# Patient Record
Sex: Female | Born: 1965 | Race: White | Hispanic: No | Smoking: Current every day smoker
Health system: Southern US, Community
[De-identification: ages and names within clinical notes are randomized; demographics above are authoritative.]

## PROBLEM LIST (undated history)

## (undated) DIAGNOSIS — L80 Vitiligo: Secondary | ICD-10-CM

## (undated) DIAGNOSIS — K219 Gastro-esophageal reflux disease without esophagitis: Secondary | ICD-10-CM

## (undated) DIAGNOSIS — E785 Hyperlipidemia, unspecified: Secondary | ICD-10-CM

## (undated) DIAGNOSIS — B009 Herpesviral infection, unspecified: Secondary | ICD-10-CM

## (undated) DIAGNOSIS — E039 Hypothyroidism, unspecified: Secondary | ICD-10-CM

## (undated) DIAGNOSIS — Z79890 Hormone replacement therapy (postmenopausal): Secondary | ICD-10-CM

## (undated) DIAGNOSIS — F419 Anxiety disorder, unspecified: Secondary | ICD-10-CM

## (undated) DIAGNOSIS — L900 Lichen sclerosus et atrophicus: Principal | ICD-10-CM

## (undated) DIAGNOSIS — Z780 Asymptomatic menopausal state: Secondary | ICD-10-CM

## (undated) DIAGNOSIS — R011 Cardiac murmur, unspecified: Secondary | ICD-10-CM

## (undated) DIAGNOSIS — R232 Flushing: Secondary | ICD-10-CM

## (undated) DIAGNOSIS — R4589 Other symptoms and signs involving emotional state: Secondary | ICD-10-CM

## (undated) HISTORY — DX: Lichen sclerosus et atrophicus: L90.0

## (undated) HISTORY — DX: Vitiligo: L80

## (undated) HISTORY — DX: Flushing: R23.2

## (undated) HISTORY — PX: TONSILLECTOMY: SUR1361

## (undated) HISTORY — DX: Hyperlipidemia, unspecified: E78.5

## (undated) HISTORY — DX: Herpesviral infection, unspecified: B00.9

## (undated) HISTORY — PX: FINGER SURGERY: SHX640

## (undated) HISTORY — PX: TOE SURGERY: SHX1073

## (undated) HISTORY — DX: Anxiety disorder, unspecified: F41.9

## (undated) HISTORY — DX: Hormone replacement therapy: Z79.890

## (undated) HISTORY — PX: TUBAL LIGATION: SHX77

## (undated) HISTORY — DX: Other symptoms and signs involving emotional state: R45.89

## (undated) HISTORY — PX: ECTOPIC PREGNANCY SURGERY: SHX613

## (undated) HISTORY — PX: ESOPHAGOGASTRODUODENOSCOPY: SHX1529

## (undated) HISTORY — DX: Asymptomatic menopausal state: Z78.0

---

## 2001-07-09 ENCOUNTER — Other Ambulatory Visit: Admission: RE | Admit: 2001-07-09 | Discharge: 2001-07-09 | Payer: Self-pay | Admitting: Obstetrics and Gynecology

## 2001-07-11 ENCOUNTER — Ambulatory Visit (HOSPITAL_COMMUNITY): Admission: RE | Admit: 2001-07-11 | Discharge: 2001-07-11 | Payer: Self-pay | Admitting: Obstetrics and Gynecology

## 2001-07-11 ENCOUNTER — Encounter: Payer: Self-pay | Admitting: Obstetrics and Gynecology

## 2003-04-20 ENCOUNTER — Encounter (HOSPITAL_COMMUNITY): Admission: RE | Admit: 2003-04-20 | Discharge: 2003-05-20 | Payer: Self-pay | Admitting: Family Medicine

## 2006-03-12 ENCOUNTER — Ambulatory Visit (HOSPITAL_COMMUNITY): Admission: RE | Admit: 2006-03-12 | Discharge: 2006-03-12 | Payer: Self-pay | Admitting: Family Medicine

## 2007-08-06 ENCOUNTER — Ambulatory Visit (HOSPITAL_COMMUNITY): Admission: RE | Admit: 2007-08-06 | Discharge: 2007-08-06 | Payer: Self-pay | Admitting: Family Medicine

## 2008-10-02 ENCOUNTER — Emergency Department (HOSPITAL_COMMUNITY): Admission: EM | Admit: 2008-10-02 | Discharge: 2008-10-02 | Payer: Self-pay | Admitting: Emergency Medicine

## 2008-10-19 ENCOUNTER — Emergency Department (HOSPITAL_COMMUNITY): Admission: EM | Admit: 2008-10-19 | Discharge: 2008-10-19 | Payer: Self-pay | Admitting: Emergency Medicine

## 2009-02-09 ENCOUNTER — Ambulatory Visit (HOSPITAL_COMMUNITY): Admission: RE | Admit: 2009-02-09 | Discharge: 2009-02-09 | Payer: Self-pay | Admitting: Family Medicine

## 2010-08-21 LAB — URINALYSIS, ROUTINE W REFLEX MICROSCOPIC
Bilirubin Urine: NEGATIVE
Glucose, UA: NEGATIVE mg/dL
Hgb urine dipstick: NEGATIVE
Ketones, ur: NEGATIVE mg/dL
Nitrite: NEGATIVE
Protein, ur: NEGATIVE mg/dL
Specific Gravity, Urine: <1.005 — ABNORMAL LOW (ref 1.005–1.030)
Urobilinogen, UA: 0.2 mg/dL (ref 0.0–1.0)
pH: 5.5 (ref 5.0–8.0)

## 2010-08-21 LAB — URINE CULTURE
Colony Count: NO GROWTH
Culture: NO GROWTH

## 2010-08-21 LAB — PREGNANCY, URINE: Preg Test, Ur: NEGATIVE

## 2010-08-22 LAB — URINALYSIS, ROUTINE W REFLEX MICROSCOPIC
Hgb urine dipstick: NEGATIVE
Ketones, ur: NEGATIVE mg/dL
Protein, ur: NEGATIVE mg/dL
Urobilinogen, UA: 0.2 mg/dL (ref 0.0–1.0)

## 2010-08-22 LAB — URINE CULTURE: Colony Count: 5000

## 2010-08-22 LAB — URINE MICROSCOPIC-ADD ON

## 2011-03-15 ENCOUNTER — Other Ambulatory Visit (HOSPITAL_COMMUNITY): Payer: Self-pay | Admitting: Family Medicine

## 2011-03-15 DIAGNOSIS — Z139 Encounter for screening, unspecified: Secondary | ICD-10-CM

## 2011-03-20 ENCOUNTER — Ambulatory Visit (HOSPITAL_COMMUNITY): Payer: Self-pay

## 2011-03-29 ENCOUNTER — Ambulatory Visit (HOSPITAL_COMMUNITY)
Admission: RE | Admit: 2011-03-29 | Discharge: 2011-03-29 | Disposition: A | Discharge status: Home / Self Care | Payer: PRIVATE HEALTH INSURANCE | Source: Ambulatory Visit | Attending: Family Medicine | Admitting: Family Medicine

## 2011-03-29 DIAGNOSIS — Z139 Encounter for screening, unspecified: Secondary | ICD-10-CM

## 2011-03-29 DIAGNOSIS — Z1231 Encounter for screening mammogram for malignant neoplasm of breast: Secondary | ICD-10-CM | POA: Insufficient documentation

## 2011-04-03 ENCOUNTER — Other Ambulatory Visit: Payer: Self-pay | Admitting: Family Medicine

## 2011-04-03 DIAGNOSIS — R928 Other abnormal and inconclusive findings on diagnostic imaging of breast: Secondary | ICD-10-CM

## 2011-04-18 ENCOUNTER — Other Ambulatory Visit: Payer: Self-pay | Admitting: Family Medicine

## 2011-04-18 ENCOUNTER — Ambulatory Visit (HOSPITAL_COMMUNITY)
Admission: RE | Admit: 2011-04-18 | Discharge: 2011-04-18 | Disposition: A | Discharge status: Home / Self Care | Payer: PRIVATE HEALTH INSURANCE | Source: Ambulatory Visit | Attending: Family Medicine | Admitting: Family Medicine

## 2011-04-18 DIAGNOSIS — R928 Other abnormal and inconclusive findings on diagnostic imaging of breast: Secondary | ICD-10-CM

## 2011-10-22 ENCOUNTER — Encounter (HOSPITAL_COMMUNITY): Payer: Self-pay | Admitting: *Deleted

## 2011-10-22 ENCOUNTER — Emergency Department (HOSPITAL_COMMUNITY)
Admission: EM | Admit: 2011-10-22 | Discharge: 2011-10-22 | Disposition: A | Discharge status: Home / Self Care | Payer: Self-pay | Attending: Emergency Medicine | Admitting: Emergency Medicine

## 2011-10-22 DIAGNOSIS — F17200 Nicotine dependence, unspecified, uncomplicated: Secondary | ICD-10-CM | POA: Insufficient documentation

## 2011-10-22 DIAGNOSIS — K219 Gastro-esophageal reflux disease without esophagitis: Secondary | ICD-10-CM | POA: Insufficient documentation

## 2011-10-22 DIAGNOSIS — B354 Tinea corporis: Secondary | ICD-10-CM | POA: Insufficient documentation

## 2011-10-22 DIAGNOSIS — Z79899 Other long term (current) drug therapy: Secondary | ICD-10-CM | POA: Insufficient documentation

## 2011-10-22 HISTORY — DX: Gastro-esophageal reflux disease without esophagitis: K21.9

## 2011-10-22 MED ORDER — DOXYCYCLINE HYCLATE 100 MG PO CAPS: 100.0000 mg | ORAL_CAPSULE | Freq: Two times a day (BID) | ORAL | Status: AC

## 2011-10-22 MED ORDER — CLOTRIMAZOLE-BETAMETHASONE 1-0.05 % EX CREA: TOPICAL_CREAM | CUTANEOUS | Status: AC

## 2011-10-22 NOTE — ED Notes (Signed)
Pt c/o rash to the back of her left leg with itching x 3 months. Seen by PCP and given cream but it did not work.

## 2011-10-22 NOTE — ED Notes (Signed)
Pt has approximately 3 x 3 inch round area of raised red rash that has been there for 6 months per patient. Pt c/o itching. Alert and oriented x 3. Skin warm and dry. Color pink. No acute distress.

## 2011-10-22 NOTE — Discharge Instructions (Signed)
Ringworm, Body [Tinea Corporis]  Ringworm is a fungal infection of the skin and hair. Another name for this problem is Tinea Corporis. It has nothing to do with worms. A fungus is an organism that lives on dead cells (the outer layer of skin). It can involve the entire body. It can spread from infected pets. Tinea corporis can be a problem in wrestlers who may get the infection form other players/opponents, equipment and mats.  DIAGNOSIS   A skin scraping can be obtained from the affected area and by looking for fungus under the microscope. This is called a KOH examination.   HOME CARE INSTRUCTIONS    Ringworm may be treated with a topical antifungal cream, ointment, or oral medications.   If you are using a cream or ointment, wash infected skin. Dry it completely before application.   Scrub the skin with a buff puff or abrasive sponge using a shampoo with ketoconazole to remove dead skin and help treat the ringworm.   Have your pet treated by your veterinarian if it has the same infection.  SEEK MEDICAL CARE IF:    Your ringworm patch (fungus) continues to spread after 7 days of treatment.   Your rash is not gone in 4 weeks. Fungal infections are slow to respond to treatment. Some redness (erythema) may remain for several weeks after the fungus is gone.   The area becomes red, warm, tender, and swollen beyond the patch. This may be a secondary bacterial (germ) infection.   You have a fever.  Document Released: 04/27/2000 Document Revised: 04/19/2011 Document Reviewed: 10/08/2008  [employer] Patient Information 2012 [employer].

## 2011-10-22 NOTE — ED Provider Notes (Signed)
History     CSN: [id number]  Arrival date & time 10/22/11  0845   First MD Initiated Contact with Patient 10/22/11 [phone]      Chief Complaint  Patient presents with  . Rash    (Consider location/radiation/quality/duration/timing/severity/associated sxs/prior treatment) Patient is a 46 y.o. female presenting with rash. The history is provided by the patient.  Rash  This is a chronic problem. The current episode started more than 1 week ago. The problem has not changed since onset.The problem is associated with an unknown factor. There has been no fever. The rash is present on the left upper leg. The patient is experiencing no pain. Associated symptoms include itching. Associated symptoms comments: Burning sensation. Treatments tried: nizoral cream. The treatment provided no relief.    Past Medical History  Diagnosis Date  . GERD (gastroesophageal reflux disease)     Past Surgical History  Procedure Date  . Ectopic pregnancy surgery     History reviewed. No pertinent family history.  History  Substance Use Topics  . Smoking status: Current Everyday Smoker -- 1.0 packs/day    Types: Cigarettes  . Smokeless tobacco: Not on file  . Alcohol Use: Yes    OB History    Grav Para Term Preterm Abortions TAB SAB Ect Mult Living                  Review of Systems  Constitutional: Negative for fever and chills.  Musculoskeletal: Negative for joint swelling and arthralgias.  Skin: Positive for itching and rash. Negative for color change and wound.  Neurological: Negative for dizziness and headaches.  All other systems reviewed and are negative.    Allergies  Review of patient's allergies indicates no known allergies.  Home Medications   Current Outpatient Rx  Name Route Sig Dispense Refill  . ALPRAZOLAM 1 MG PO TABS Oral Take 1 mg by mouth at bedtime as needed. For sleep    . VITAMIN C PO Oral Take 1 tablet by mouth daily.    Marland Kitchen CALCIUM CARBONATE-VITAMIN D 500-200  MG-UNIT PO TABS Oral Take 1 tablet by mouth daily.    Marland Kitchen CITALOPRAM HYDROBROMIDE 40 MG PO TABS Oral Take 40 mg by mouth daily.    Marland Kitchen CLOTRIMAZOLE 1 % EX CREA Topical Apply 1 application topically 2 (two) times daily.    . GUAIFENESIN ER 600 MG PO TB12 Oral Take 600 mg by mouth 2 (two) times daily as needed. For congestion    . IBUPROFEN 200 MG PO TABS Oral Take 200 mg by mouth every 6 (six) hours as needed.    Marland Kitchen KETOCONAZOLE 2 % EX CREA Topical Apply 1 application topically 2 (two) times daily.    Marland Kitchen KETOTIFEN FUMARATE 0.025 % OP SOLN Ophthalmic Apply 1 drop to eye 2 (two) times daily as needed. For allergies    . FISH OIL 1000 MG PO CAPS Oral Take 1 capsule by mouth daily.    . DAYQUIL PO Oral Take 2 capsules by mouth daily as needed. For cold symptoms    . COUGH DROPS MT Mouth/Throat Use as directed 1 tablet in the mouth or throat as needed. For cough    . CLOTRIMAZOLE-BETAMETHASONE 1-0.05 % EX CREA  Apply to affected area 2 times daily prn 30 g 0  . DOXYCYCLINE HYCLATE 100 MG PO CAPS Oral Take 1 capsule (100 mg total) by mouth 2 (two) times daily. For 14 days 28 capsule 0    BP 135/47  Pulse 84  Temp(Src) 98.4 F (36.9 C) (Oral)  Resp 16  Ht 5' 6.75" (1.695 m)  Wt 160 lb (72.576 kg)  BMI 25.25 kg/m2  SpO2 96%  Physical Exam  Constitutional: She is oriented to person, place, and time. She appears well-developed and well-nourished. No distress.  HENT:  Head: Normocephalic and atraumatic.  Musculoskeletal: She exhibits no edema and no tenderness.       Left upper leg: She exhibits no tenderness, no bony tenderness, no swelling, no edema, no deformity and no laceration.       Legs: Neurological: She is alert and oriented to person, place, and time. She exhibits normal muscle tone. Coordination normal.  Skin: Skin is warm and dry. Rash noted.    ED Course  Procedures (including critical care time)    1. Tinea corporis       MDM   Single  Slight raised lesion to the left  posterior thigh.  Likely fungal.  Pt reports hx of same that improved with anti-fungal and doxy  Pt agrees to return here if the sx worsen.   Patient / Family / Caregiver understand and agree with initial ED impression and plan with expectations set for ED visit. Pt stable in ED with no significant deterioration in condition. Pt feels improved after observation and/or treatment in ED.     Prescribed : Lotrisone cream and doxy   Jaime Dome L. [place], [place] 10/22/11 2146

## 2011-10-26 NOTE — ED Provider Notes (Signed)
Medical screening examination/treatment/procedure(s) were performed by non-physician practitioner and as supervising physician I was immediately available for consultation/collaboration.  Amilia Vandenbrink T Corderro Koloski, MD 10/26/11 1628 

## 2012-04-03 ENCOUNTER — Other Ambulatory Visit (HOSPITAL_COMMUNITY): Payer: Self-pay | Admitting: Nurse Practitioner

## 2012-04-03 DIAGNOSIS — Z139 Encounter for screening, unspecified: Secondary | ICD-10-CM

## 2012-04-21 ENCOUNTER — Ambulatory Visit (HOSPITAL_COMMUNITY)
Admission: RE | Admit: 2012-04-21 | Discharge: 2012-04-21 | Disposition: A | Discharge status: Home / Self Care | Payer: PRIVATE HEALTH INSURANCE | Source: Ambulatory Visit | Attending: Nurse Practitioner | Admitting: Nurse Practitioner

## 2012-04-21 DIAGNOSIS — Z139 Encounter for screening, unspecified: Secondary | ICD-10-CM

## 2012-04-21 DIAGNOSIS — Z1231 Encounter for screening mammogram for malignant neoplasm of breast: Secondary | ICD-10-CM | POA: Insufficient documentation

## 2012-07-31 ENCOUNTER — Ambulatory Visit: Payer: Self-pay | Admitting: Orthopedic Surgery

## 2012-08-06 ENCOUNTER — Ambulatory Visit: Payer: Self-pay | Admitting: Orthopedic Surgery

## 2012-10-27 ENCOUNTER — Ambulatory Visit (HOSPITAL_COMMUNITY): Payer: Self-pay

## 2012-10-27 ENCOUNTER — Other Ambulatory Visit (HOSPITAL_COMMUNITY): Payer: Self-pay | Admitting: Family Medicine

## 2012-10-27 ENCOUNTER — Ambulatory Visit (HOSPITAL_COMMUNITY)
Admission: RE | Admit: 2012-10-27 | Discharge: 2012-10-27 | Disposition: A | Discharge status: Home / Self Care | Payer: Self-pay | Source: Ambulatory Visit | Attending: Family Medicine | Admitting: Family Medicine

## 2012-10-27 DIAGNOSIS — D1779 Benign lipomatous neoplasm of other sites: Secondary | ICD-10-CM | POA: Insufficient documentation

## 2012-10-27 DIAGNOSIS — D179 Benign lipomatous neoplasm, unspecified: Secondary | ICD-10-CM

## 2013-05-21 ENCOUNTER — Ambulatory Visit (INDEPENDENT_AMBULATORY_CARE_PROVIDER_SITE_OTHER): Payer: 59

## 2013-05-21 ENCOUNTER — Ambulatory Visit (INDEPENDENT_AMBULATORY_CARE_PROVIDER_SITE_OTHER): Payer: 59 | Admitting: Orthopedic Surgery

## 2013-05-21 ENCOUNTER — Encounter: Payer: Self-pay | Admitting: Orthopedic Surgery

## 2013-05-21 VITALS — BP 115/61 | Ht 66.0 in | Wt 160.0 lb

## 2013-05-21 DIAGNOSIS — IMO0002 Reserved for concepts with insufficient information to code with codable children: Secondary | ICD-10-CM

## 2013-05-21 DIAGNOSIS — M25569 Pain in unspecified knee: Secondary | ICD-10-CM

## 2013-05-21 DIAGNOSIS — M1711 Unilateral primary osteoarthritis, right knee: Secondary | ICD-10-CM | POA: Insufficient documentation

## 2013-05-21 DIAGNOSIS — M23321 Other meniscus derangements, posterior horn of medial meniscus, right knee: Secondary | ICD-10-CM

## 2013-05-21 DIAGNOSIS — M25561 Pain in right knee: Secondary | ICD-10-CM | POA: Insufficient documentation

## 2013-05-21 DIAGNOSIS — M1710 Unilateral primary osteoarthritis, unspecified knee: Secondary | ICD-10-CM

## 2013-05-21 DIAGNOSIS — M23006 Cystic meniscus, unspecified meniscus, right knee: Secondary | ICD-10-CM

## 2013-05-21 DIAGNOSIS — M23329 Other meniscus derangements, posterior horn of medial meniscus, unspecified knee: Secondary | ICD-10-CM | POA: Insufficient documentation

## 2013-05-21 DIAGNOSIS — M23009 Cystic meniscus, unspecified meniscus, unspecified knee: Secondary | ICD-10-CM | POA: Insufficient documentation

## 2013-05-21 DIAGNOSIS — M23302 Other meniscus derangements, unspecified lateral meniscus, unspecified knee: Secondary | ICD-10-CM

## 2013-05-21 NOTE — Patient Instructions (Signed)
Knee Pain Knee pain can be a result of an injury or other medical conditions. Treatment will depend on the cause of your pain. HOME CARE  Only take medicine as told by your doctor.  Keep a healthy weight. Being overweight can make the knee hurt more.  Stretch before exercising or playing sports.  If there is constant knee pain, change the way you exercise. Ask your doctor for advice.  Make sure shoes fit well. Choose the right shoe for the sport or activity.  Protect your knees. Wear kneepads if needed.  Rest when you are tired. GET HELP RIGHT AWAY IF:   Your knee pain does not stop.  Your knee pain does not get better.  Your knee joint feels hot to the touch.  You have a fever. MAKE SURE YOU:   Understand these instructions.  Will watch this condition.  Will get help right away if you are not doing well or get worse. Document Released: 07/27/2008 Document Revised: 07/23/2011 Document Reviewed: 07/27/2008 [facility] Patient Information 2014 [place], [place]. Joint Injection Care After Refer to this sheet in the next few days. These instructions provide you with information on caring for yourself after you have had a joint injection. Your caregiver also may give you more specific instructions. Your treatment has been planned according to current medical practices, but problems sometimes occur. Call your caregiver if you have any problems or questions after your procedure. After any type of joint injection, it is not uncommon to experience:  Soreness, swelling, or bruising around the injection site.  Mild numbness, tingling, or weakness around the injection site caused by the numbing medicine used before or with the injection. It also is possible to experience the following effects associated with the specific agent after injection:  Iodine-based contrast agents:  Allergic reaction (itching, hives, widespread redness, and swelling beyond the injection  site).  Corticosteroids (These effects are rare.):  Allergic reaction.  Increased blood sugar levels (If you have diabetes and you notice that your blood sugar levels have increased, notify your caregiver).  Increased blood pressure levels.  Mood swings.  Hyaluronic acid in the use of viscosupplementation.  Temporary heat or redness.  Temporary rash and itching.  Increased fluid accumulation in the injected joint. These effects all should resolve within a day after your procedure.  HOME CARE INSTRUCTIONS  Limit yourself to light activity the day of your procedure. Avoid lifting heavy objects, bending, stooping, or twisting.  Take prescription or over-the-counter pain medication as directed by your caregiver.  You may apply ice to your injection site to reduce pain and swelling the day of your procedure. Ice may be applied 03-04 times:  Put ice in a plastic bag.  Place a towel between your skin and the bag.  Leave the ice on for no longer than 15-20 minutes each time. SEEK IMMEDIATE MEDICAL CARE IF:   Pain and swelling get worse rather than better or extend beyond the injection site.  Numbness does not go away.  Blood or fluid continues to leak from the injection site.  You have chest pain.  You have swelling of your face or tongue.  You have trouble breathing or you become dizzy.  You develop a fever, chills, or severe tenderness at the injection site that last longer than 1 day. MAKE SURE YOU:  Understand these instructions.  Watch your condition.  Get help right away if you are not doing well or if you get worse. Document Released: 01/11/2011 Document Revised: 07/23/2011  Document Reviewed: 01/11/2011 [facility] Patient Information 2014 [place], [place].

## 2013-05-21 NOTE — Progress Notes (Signed)
Patient ID: FENNA SEMEL, female   DOB: 06-26-1965, 48 y.o.   MRN: [id number] Chief Complaint  Patient presents with  . Knee Pain    Right knee pain, injury a year ago in August. Referred by Prince Solian, PA    48 year old female with history of anteromedial knee pain and swelling for one year no trauma complains of swelling pain when it rains when the weather is cold unrelieved by oral steroids associated with dull throbbing 5/10 constant pain worse with walking  Heartburn swelling of the joint anxiety and seasonal allergies or positive review of systems findings the other findings listed by the patient is normal she denies any medical problems takes Xanax and citalopram she had surgery on one of her fingers a tonsillectomy she had some tubal pregnancy she has no allergies she has a family history of heart disease cancer and diabetes she is divorced she smokes a pack of cigarettes per day she does drink socially.  There is a cystic versus lipomatous structure with anteromedial joint line with tenderness over the posterior medial joint line normal range of motion intact ligaments normal motor exam normal skin normal pulses normal sensation no lymph nodes normal reflexes normal ambulation normal mood appearance is normal she is oriented x3 BP 115/61  Ht 5\' 6"  (1.676 m)  Wt 160 lb (72.576 kg)  BMI 25.84 kg/m2   X-ray shows mild arthritis  I injected her knee for possible meniscal cyst versus meniscal tear with osteoarthritis  Return 6 weeks  Knee  Injection Procedure Note  Pre-operative Diagnosis: right knee oa  Post-operative Diagnosis: same  Indications: pain  Anesthesia: ethyl chloride   Procedure Details   Verbal consent was obtained for the procedure. Time out was completed.The joint was prepped with alcohol, followed by  Ethyl chloride spray and A 20 gauge needle was inserted into the knee via lateral approach; 106ml 1% lidocaine and 1 ml of depomedrol  was then injected into the  joint . The needle was removed and the area cleansed and dressed.  Complications:  None; patient tolerated the procedure well.

## 2013-05-25 ENCOUNTER — Telehealth: Payer: Self-pay | Admitting: Orthopedic Surgery

## 2013-05-25 NOTE — Telephone Encounter (Signed)
Please call Marjory Sneddon, she has a question about her office visit last Thursday. Her # [phone]

## 2013-05-25 NOTE — Telephone Encounter (Signed)
I Returned call to patient. She was asking what her xray showed that she had done in the office. I advised her from DR. Harrison's note in the chart,  that her xray showed mild arthritis.

## 2013-07-02 ENCOUNTER — Ambulatory Visit: Payer: 59 | Admitting: Orthopedic Surgery

## 2013-10-23 ENCOUNTER — Emergency Department (HOSPITAL_COMMUNITY)
Admission: EM | Admit: 2013-10-23 | Discharge: 2013-10-23 | Disposition: A | Discharge status: Home / Self Care | Payer: 59 | Attending: Emergency Medicine | Admitting: Emergency Medicine

## 2013-10-23 ENCOUNTER — Encounter (HOSPITAL_COMMUNITY): Payer: Self-pay | Admitting: Emergency Medicine

## 2013-10-23 ENCOUNTER — Emergency Department (HOSPITAL_COMMUNITY): Payer: 59

## 2013-10-23 DIAGNOSIS — R079 Chest pain, unspecified: Secondary | ICD-10-CM

## 2013-10-23 DIAGNOSIS — F17200 Nicotine dependence, unspecified, uncomplicated: Secondary | ICD-10-CM | POA: Insufficient documentation

## 2013-10-23 DIAGNOSIS — Z79899 Other long term (current) drug therapy: Secondary | ICD-10-CM | POA: Insufficient documentation

## 2013-10-23 DIAGNOSIS — R05 Cough: Secondary | ICD-10-CM | POA: Insufficient documentation

## 2013-10-23 DIAGNOSIS — Z8719 Personal history of other diseases of the digestive system: Secondary | ICD-10-CM | POA: Insufficient documentation

## 2013-10-23 DIAGNOSIS — R0789 Other chest pain: Secondary | ICD-10-CM | POA: Insufficient documentation

## 2013-10-23 DIAGNOSIS — Z7982 Long term (current) use of aspirin: Secondary | ICD-10-CM | POA: Insufficient documentation

## 2013-10-23 DIAGNOSIS — R059 Cough, unspecified: Secondary | ICD-10-CM | POA: Insufficient documentation

## 2013-10-23 DIAGNOSIS — R0602 Shortness of breath: Secondary | ICD-10-CM | POA: Insufficient documentation

## 2013-10-23 LAB — CBC WITH DIFFERENTIAL/PLATELET
Basophils Absolute: 0 10*3/uL (ref 0.0–0.1)
Basophils Relative: 0 % (ref 0–1)
Eosinophils Absolute: 0.1 10*3/uL (ref 0.0–0.7)
Eosinophils Relative: 2 % (ref 0–5)
HEMATOCRIT: 41.2 % (ref 36.0–46.0)
HEMOGLOBIN: 14.4 g/dL (ref 12.0–15.0)
Lymphocytes Relative: 42 % (ref 12–46)
Lymphs Abs: 3.7 10*3/uL (ref 0.7–4.0)
MCH: 31 pg (ref 26.0–34.0)
MCHC: 35 g/dL (ref 30.0–36.0)
MCV: 88.6 fL (ref 78.0–100.0)
MONOS PCT: 10 % (ref 3–12)
Monocytes Absolute: 0.8 10*3/uL (ref 0.1–1.0)
NEUTROS ABS: 4.1 10*3/uL (ref 1.7–7.7)
Neutrophils Relative %: 46 % (ref 43–77)
Platelets: 323 10*3/uL (ref 150–400)
RBC: 4.65 MIL/uL (ref 3.87–5.11)
RDW: 13 % (ref 11.5–15.5)
WBC: 8.8 10*3/uL (ref 4.0–10.5)

## 2013-10-23 LAB — BASIC METABOLIC PANEL
BUN: 11 mg/dL (ref 6–23)
CHLORIDE: 100 meq/L (ref 96–112)
CO2: 30 meq/L (ref 19–32)
Calcium: 10.6 mg/dL — ABNORMAL HIGH (ref 8.4–10.5)
Creatinine, Ser: 0.56 mg/dL (ref 0.50–1.10)
GFR calc non Af Amer: >90 mL/min (ref >90)
Glucose, Bld: 95 mg/dL (ref 70–99)
POTASSIUM: 3.9 meq/L (ref 3.7–5.3)
Sodium: 142 mEq/L (ref 137–147)

## 2013-10-23 LAB — TROPONIN I: Troponin I: <0.3 ng/mL (ref <0.30)

## 2013-10-23 LAB — D-DIMER, QUANTITATIVE: D-Dimer, Quant: 0.41 ug/mL-FEU (ref 0.00–0.48)

## 2013-10-23 MED ORDER — TRAMADOL HCL 50 MG PO TABS: 50.0000 mg | ORAL_TABLET | Freq: Four times a day (QID) | ORAL | Status: DC | PRN

## 2013-10-23 MED ORDER — RANITIDINE HCL 150 MG PO TABS: 150.0000 mg | ORAL_TABLET | Freq: Two times a day (BID) | ORAL | Status: DC

## 2013-10-23 MED ORDER — KETOROLAC TROMETHAMINE 30 MG/ML IJ SOLN
30.0000 mg | Freq: Once | INTRAMUSCULAR | Status: AC
  Administered 2013-10-23: 30 mg via INTRAVENOUS
  Filled 2013-10-23: qty 1

## 2013-10-23 NOTE — ED Provider Notes (Addendum)
CSN: [id number]     Arrival date & time 10/23/13  1602 History  This chart was scribed for Orpah Greek, MD by Ladene Artist, ED Scribe. The patient was seen in room APA07/APA07. Patient's care was started at 4:40 PM.   Chief Complaint  Patient presents with  . Chest Pain   The history is provided by the patient. No language interpreter was used.   HPI Comments: [name] [name] is a 48 y.o. female who presents to the Emergency Department complaining of nonexertional, L central chest pain that radiates to L shoulder blade onset 8 AM this morning. Pt reports associated SOB and cough. She denies leg swelling, abdominal pain. Pain is exacerbated with deep breaths. She has taken Rolaid for pain. No h/o heart attack. Pt's mother had a h/o heart attack at age 56. Pt had stress test in 2007.  Past Medical History  Diagnosis Date  . GERD (gastroesophageal reflux disease)    Past Surgical History  Procedure Laterality Date  . Ectopic pregnancy surgery    . Tonsillectomy    . Tubal ligation     History reviewed. No pertinent family history. History  Substance Use Topics  . Smoking status: Current Every Day Smoker -- 1.00 packs/day    Types: Cigarettes  . Smokeless tobacco: Not on file  . Alcohol Use: Yes   OB History   Grav Para Term Preterm Abortions TAB SAB Ect Mult Living                 Review of Systems  Respiratory: Positive for cough and shortness of breath.   Cardiovascular: Positive for chest pain. Negative for leg swelling.  Gastrointestinal: Negative for abdominal pain.  All other systems reviewed and are negative.  Allergies  Review of patient's allergies indicates no known allergies.  Home Medications   Prior to Admission medications   Medication Sig Start Date End Date Taking? Authorizing Provider  ALPRAZolam Duanne Moron) 1 MG tablet Take 1 mg by mouth at bedtime as needed. For sleep   Yes Historical Provider, MD  aspirin EC 81 MG tablet Take 81-162 mg by mouth  daily.   Yes Historical Provider, MD  citalopram (CELEXA) 40 MG tablet Take 40 mg by mouth daily.   Yes Historical Provider, MD  ibuprofen (ADVIL,MOTRIN) 200 MG tablet Take 400 mg by mouth every 6 (six) hours as needed for fever, mild pain or moderate pain.    Yes Historical Provider, MD  phenylephrine (SUDAFED PE) 10 MG TABS tablet Take 10 mg by mouth every 4 (four) hours as needed (for allergy relief).   Yes Historical Provider, MD   Triage Vitals: BP 122/52  Pulse 64  Temp(Src) 98 F (36.7 C) (Oral)  Resp 20  Ht 5\' 6"  (1.676 m)  Wt 160 lb (72.576 kg)  BMI 25.84 kg/m2  SpO2 95% Physical Exam  Constitutional: She is oriented to person, place, and time. She appears well-developed and well-nourished. No distress.  HENT:  Head: Normocephalic and atraumatic.  Right Ear: Hearing normal.  Left Ear: Hearing normal.  Nose: Nose normal.  Mouth/Throat: Oropharynx is clear and moist and mucous membranes are normal.  Eyes: Conjunctivae and EOM are normal. Pupils are equal, round, and reactive to light.  Neck: Normal range of motion. Neck supple.  Cardiovascular: Normal rate, regular rhythm, S1 normal and S2 normal.  Exam reveals no gallop and no friction rub.   No murmur heard. Pulmonary/Chest: Effort normal and breath sounds normal. No respiratory distress. She exhibits  no tenderness.  Abdominal: Soft. Normal appearance and bowel sounds are normal. There is no hepatosplenomegaly. There is no tenderness. There is no rebound, no guarding, no tenderness at McBurney's point and negative Murphy's sign. No hernia.  Musculoskeletal: Normal range of motion.  Neurological: She is alert and oriented to person, place, and time. She has normal strength. No cranial nerve deficit or sensory deficit. Coordination normal. GCS eye subscore is 4. GCS verbal subscore is 5. GCS motor subscore is 6.  Skin: Skin is warm, dry and intact. No rash noted. No cyanosis.  Psychiatric: She has a normal mood and affect. Her  speech is normal and behavior is normal. Thought content normal.   ED Course  Procedures (including critical care time) DIAGNOSTIC STUDIES: Oxygen Saturation is 95% on RA, adequate by my interpretation.    COORDINATION OF CARE: 4:43 PM Discussed treatment plan with pt at bedside and pt agreed to plan.  Labs Review Labs Reviewed  CBC WITH DIFFERENTIAL  BASIC METABOLIC PANEL  TROPONIN I  D-DIMER, QUANTITATIVE  BASIC METABOLIC PANEL  CBC WITH DIFFERENTIAL  D-DIMER, QUANTITATIVE  TROPONIN I   Imaging Review Dg Chest 2 View  10/23/2013   CLINICAL DATA:  Chest pain, shortness of breath.  EXAM: CHEST  2 VIEW  COMPARISON:  10/27/2012  FINDINGS: The heart size and mediastinal contours are within normal limits. Both lungs are clear. The visualized skeletal structures are unremarkable.  IMPRESSION: No active cardiopulmonary disease.   Electronically Signed   By: Rolm Baptise M.D.   On: 10/23/2013 17:23    EKG Interpretation   Date/Time:  Friday October 23 2013 16:22:55 EDT Ventricular Rate:  68 PR Interval:  161 QRS Duration: 84 QT Interval:  418 QTC Calculation: 444 R Axis:   70 Text Interpretation:  Sinus rhythm Normal ECG Confirmed by POLLINA  MD,  CHRISTOPHER [phone]) on 10/23/2013 7:29:43 PM      MDM   Final diagnoses:  None    Patient presents to the ER for evaluation of chest pain. Patient's pain is very atypical for cardiac chest pain. She is experiencing pain only upon inspiration. There is no continuous chest pain no exertional chest pain. She does have a history of GERD, otherwise no significant past medical history. She does have family history of heart disease and is a smoker. EKG performed upon arrival, however, did not show any acute abnormality. Troponin is negative. Patient's symptoms are more consistent with a chest wall or pulmonary etiology. Chest x-ray did not show any evidence of pneumonia or other abnormality. D-dimer was negative. He is felt to be unlikely based  on her current workup, vital signs and negative d-dimer.PERC/Wells also negative. She reassured, treated with analgesia and rest.  I personally performed the services described in this documentation, which was scribed in my presence. The recorded information has been reviewed and is accurate.    Orpah Greek, MD 10/23/13 1929    (Downtime lab results used for medical decision-making)  Orpah Greek, MD 10/23/13 1930

## 2013-10-23 NOTE — ED Notes (Signed)
Pain in chest and lt scapula , onset this morning, worse with deep breath

## 2014-03-18 ENCOUNTER — Other Ambulatory Visit (HOSPITAL_COMMUNITY): Payer: Self-pay | Admitting: *Deleted

## 2014-03-18 DIAGNOSIS — Z1231 Encounter for screening mammogram for malignant neoplasm of breast: Secondary | ICD-10-CM

## 2014-03-19 ENCOUNTER — Emergency Department (HOSPITAL_COMMUNITY)
Admission: EM | Admit: 2014-03-19 | Discharge: 2014-03-19 | Disposition: A | Discharge status: Home / Self Care | Payer: 59 | Attending: Emergency Medicine | Admitting: Emergency Medicine

## 2014-03-19 ENCOUNTER — Encounter (HOSPITAL_COMMUNITY): Payer: Self-pay | Admitting: Emergency Medicine

## 2014-03-19 DIAGNOSIS — Z79899 Other long term (current) drug therapy: Secondary | ICD-10-CM | POA: Insufficient documentation

## 2014-03-19 DIAGNOSIS — Z7982 Long term (current) use of aspirin: Secondary | ICD-10-CM | POA: Insufficient documentation

## 2014-03-19 DIAGNOSIS — N766 Ulceration of vulva: Secondary | ICD-10-CM

## 2014-03-19 DIAGNOSIS — Z720 Tobacco use: Secondary | ICD-10-CM | POA: Insufficient documentation

## 2014-03-19 DIAGNOSIS — N898 Other specified noninflammatory disorders of vagina: Secondary | ICD-10-CM | POA: Insufficient documentation

## 2014-03-19 DIAGNOSIS — L98499 Non-pressure chronic ulcer of skin of other sites with unspecified severity: Secondary | ICD-10-CM | POA: Insufficient documentation

## 2014-03-19 LAB — RPR

## 2014-03-19 LAB — URINALYSIS, ROUTINE W REFLEX MICROSCOPIC
Bilirubin Urine: NEGATIVE
Glucose, UA: NEGATIVE mg/dL
Hgb urine dipstick: NEGATIVE
Ketones, ur: NEGATIVE mg/dL
Nitrite: NEGATIVE
PH: 5.5 (ref 5.0–8.0)
Protein, ur: NEGATIVE mg/dL
Specific Gravity, Urine: <1.005 — ABNORMAL LOW (ref 1.005–1.030)
Urobilinogen, UA: 0.2 mg/dL (ref 0.0–1.0)

## 2014-03-19 LAB — WET PREP, GENITAL
Trich, Wet Prep: NONE SEEN
Yeast Wet Prep HPF POC: NONE SEEN

## 2014-03-19 LAB — URINE MICROSCOPIC-ADD ON

## 2014-03-19 LAB — HIV ANTIBODY (ROUTINE TESTING W REFLEX): HIV: NONREACTIVE

## 2014-03-19 MED ORDER — ACYCLOVIR 400 MG PO TABS: 400.0000 mg | ORAL_TABLET | Freq: Three times a day (TID) | ORAL | Status: DC

## 2014-03-19 MED ORDER — METRONIDAZOLE 500 MG PO TABS: 500.0000 mg | ORAL_TABLET | Freq: Two times a day (BID) | ORAL | Status: DC

## 2014-03-19 MED ORDER — HYDROCODONE-ACETAMINOPHEN 5-325 MG PO TABS: 2.0000 | ORAL_TABLET | ORAL | Status: DC | PRN

## 2014-03-19 NOTE — ED Provider Notes (Signed)
CSN: [id number]     Arrival date & time 03/19/14  0703 History   First MD Initiated Contact with Patient 03/19/14 0705    This chart was scribed for Orlie Dakin, MD by Terressa Koyanagi, ED Scribe. This patient was seen in room APA07/APA07 and the patient's care was started at 7:15 AM.  Chief Complaint  Patient presents with  . Abscess   HPI PCP: Glo Herring., MD .HPI Comments: [name] [name] is a 48 y.o. female, with medical Hx noted below including tobacco use (1ppd) and menopause (last period 4 months ago), who presents to the Emergency Department complaining of a painful area to her vagina onset 2 weeks ago. Pt also complains of associated yellow vaginal discharge. Pt reports having the same sexual partner for "years." symptoms worse when her underwear rubs against the area. Not improved by anything. She is treated herself with peroxide without relief. No other associated symptoms  Pt denies fever, chills, change of intake PO, or any chronic health problems, illegal drug use. Pt denies any known allergies to meds.   Past Medical History  Diagnosis Date  . GERD (gastroesophageal reflux disease)    Past Surgical History  Procedure Laterality Date  . Ectopic pregnancy surgery    . Tonsillectomy    . Tubal ligation    left oophorectomy No family history on file. History  Substance Use Topics  . Smoking status: Current Every Day Smoker -- 1.00 packs/day    Types: Cigarettes  . Smokeless tobacco: Not on file  . Alcohol Use: Yes   OB History    No data available     Review of Systems  Constitutional: Negative.   HENT: Negative.   Respiratory: Negative.   Cardiovascular: Negative.   Gastrointestinal: Negative.   Genitourinary: Positive for vaginal discharge and genital sores. Negative for vaginal bleeding.  Musculoskeletal: Negative.   Skin: Negative.   Neurological: Negative.   Psychiatric/Behavioral: Negative.  Negative for confusion.  All other systems reviewed and are  negative.     Allergies  Review of patient's allergies indicates no known allergies.  Home Medications   Prior to Admission medications   Medication Sig Start Date End Date Taking? Authorizing Provider  ALPRAZolam Duanne Moron) 1 MG tablet Take 1 mg by mouth at bedtime as needed. For sleep    Historical Provider, MD  aspirin EC 81 MG tablet Take 81-162 mg by mouth daily.    Historical Provider, MD  citalopram (CELEXA) 40 MG tablet Take 40 mg by mouth daily.    Historical Provider, MD  ibuprofen (ADVIL,MOTRIN) 200 MG tablet Take 400 mg by mouth every 6 (six) hours as needed for fever, mild pain or moderate pain.     Historical Provider, MD  phenylephrine (SUDAFED PE) 10 MG TABS tablet Take 10 mg by mouth every 4 (four) hours as needed (for allergy relief).    Historical Provider, MD  ranitidine (ZANTAC) 150 MG tablet Take 1 tablet (150 mg total) by mouth 2 (two) times daily. 10/23/13   Orpah Greek, MD  traMADol (ULTRAM) 50 MG tablet Take 1 tablet (50 mg total) by mouth every 6 (six) hours as needed. 10/23/13   Orpah Greek, MD   Triage Vitals: BP 128/100 mmHg  Pulse 105  Temp(Src) 98.1 F (36.7 C) (Oral)  Resp 18  Ht 5' 6.75" (1.695 m)  Wt 169 lb (76.658 kg)  BMI 26.68 kg/m2  SpO2 99% Physical Exam  Constitutional: She appears well-developed and well-nourished.  HENT:  Head:  Normocephalic and atraumatic.  Eyes: Conjunctivae are normal. Pupils are equal, round, and reactive to light.  Neck: Neck supple. No tracheal deviation present. No thyromegaly present.  Cardiovascular: Normal rate and regular rhythm.   No murmur heard. Pulmonary/Chest: Effort normal and breath sounds normal.  Abdominal: Soft. Bowel sounds are normal. She exhibits no distension. There is no tenderness.  Genitourinary:  3 mm whitish ulcer on labia minora left side which is exquisitely tender to touch. Slight yellowish vaginal discharge. Cervical os closed. No cervical motion tenderness no adnexal  masses or tenderness.no labial abscess  Musculoskeletal: Normal range of motion. She exhibits no edema or tenderness.  Neurological: She is alert. Coordination normal.  Skin: Skin is warm and dry. No rash noted.  Psychiatric: She has a normal mood and affect.  Nursing note and vitals reviewed.   ED Course  Procedures (including critical care time) DIAGNOSTIC STUDIES: Oxygen Saturation is 99% on RA, nl by my interpretation.    COORDINATION OF CARE: 7:19 AM-Discussed treatment plan which includes pelvic exam, UA, and labs with pt at bedside and pt agreed to plan.   Labs Review Labs Reviewed - No data to display  Imaging Review No results found.   EKG Interpretation None      MDM  Concern for genital herpes given exquisitely painful genital ulcer  Final diagnoses:  None  plan prescription acyclovir 400 mg 3 times daily.Norco ,Flagylfollow-up with Dr. Gerarda Fraction. Cultures pending  Dx #! Genital ulcer #2 vaginitis      Orlie Dakin, MD 03/19/14 [phone]

## 2014-03-19 NOTE — ED Notes (Signed)
PT c/o raised red painful area to groin x2 weeks. PT denies any fever or chills.

## 2014-03-19 NOTE — Discharge Instructions (Signed)
Genital Herpes Don't take the pain medicine prescribed together with Xanax as accommodations be dangerous. Take Tylenol for mild pain or the pain medicine prescribed for bad pain. Contact Dr.Fusco next week. Ask him to obtain your test results for you. Other tests for sexually transmitted diseases and an HIV AIDS test are pending. You will be called if they are abnormal Genital herpes is a sexually transmitted disease. This means that it is a disease passed by having sex with an infected person. There is no cure for genital herpes. The time between attacks can be months to years. The virus may live in a person but produce no problems (symptoms). This infection can be passed to a baby as it travels down the birth canal (vagina). In a newborn, this can cause central nervous system damage, eye damage, or even death. The virus that causes genital herpes is usually HSV-2 virus. The virus that causes oral herpes is usually HSV-1. The diagnosis (learning what is wrong) is made through culture results. SYMPTOMS  Usually symptoms of pain and itching begin a few days to a week after contact. It first appears as small blisters that progress to small painful ulcers which then scab over and heal after several days. It affects the outer genitalia, birth canal, cervix, penis, anal area, buttocks, and thighs. HOME CARE INSTRUCTIONS   Keep ulcerated areas dry and clean.  Take medications as directed. Antiviral medications can speed up healing. They will not prevent recurrences or cure this infection. These medications can also be taken for suppression if there are frequent recurrences.  While the infection is active, it is contagious. Avoid all sexual contact during active infections.  Condoms may help prevent spread of the herpes virus.  Practice safe sex.  Wash your hands thoroughly after touching the genital area.  Avoid touching your eyes after touching your genital area.  Inform your caregiver if you have  had genital herpes and become pregnant. It is your responsibility to insure a safe outcome for your baby in this pregnancy.  Only take over-the-counter or prescription medicines for pain, discomfort, or fever as directed by your caregiver. SEEK MEDICAL CARE IF:   You have a recurrence of this infection.  You do not respond to medications and are not improving.  You have new sources of pain or discharge which have changed from the original infection.  You have an oral temperature above 102 F (38.9 C).  You develop abdominal pain.  You develop eye pain or signs of eye infection. Document Released: 04/27/2000 Document Revised: 07/23/2011 Document Reviewed: 05/18/2009 [facility] Patient Information 2015 [place], [place]. This information is not intended to replace advice given to you by your health care provider. Make sure you discuss any questions you have with your health care provider.

## 2014-03-20 LAB — GC/CHLAMYDIA PROBE AMP
CT PROBE, AMP APTIMA: NEGATIVE
GC PROBE AMP APTIMA: NEGATIVE

## 2014-03-23 ENCOUNTER — Telehealth (HOSPITAL_BASED_OUTPATIENT_CLINIC_OR_DEPARTMENT_OTHER): Payer: Self-pay | Admitting: Emergency Medicine

## 2014-03-23 NOTE — Telephone Encounter (Signed)
Received call from [facility] lab about corrected lab results of HSV 1 and HSV 2 results were incorrectly reported and now corrected

## 2014-03-24 ENCOUNTER — Telehealth (HOSPITAL_BASED_OUTPATIENT_CLINIC_OR_DEPARTMENT_OTHER): Payer: Self-pay | Admitting: Emergency Medicine

## 2014-03-25 LAB — HERPES SIMPLEX VIRUS(HSV) DNA BY PCR
HSV 1 DNA: NOT DETECTED
HSV 2 DNA: DETECTED

## 2014-03-29 ENCOUNTER — Ambulatory Visit (HOSPITAL_COMMUNITY): Payer: 59

## 2014-04-30 ENCOUNTER — Ambulatory Visit (HOSPITAL_COMMUNITY)
Admission: RE | Admit: 2014-04-30 | Discharge: 2014-04-30 | Disposition: A | Discharge status: Home / Self Care | Payer: PRIVATE HEALTH INSURANCE | Source: Ambulatory Visit | Attending: *Deleted | Admitting: *Deleted

## 2014-04-30 DIAGNOSIS — Z1231 Encounter for screening mammogram for malignant neoplasm of breast: Secondary | ICD-10-CM

## 2015-02-01 ENCOUNTER — Other Ambulatory Visit: Payer: Self-pay | Admitting: Adult Health

## 2015-02-15 ENCOUNTER — Other Ambulatory Visit (HOSPITAL_COMMUNITY)
Admission: RE | Admit: 2015-02-15 | Discharge: 2015-02-15 | Disposition: A | Discharge status: Home / Self Care | Payer: 59 | Source: Ambulatory Visit | Attending: Adult Health | Admitting: Adult Health

## 2015-02-15 ENCOUNTER — Ambulatory Visit (INDEPENDENT_AMBULATORY_CARE_PROVIDER_SITE_OTHER): Payer: 59 | Admitting: Adult Health

## 2015-02-15 ENCOUNTER — Encounter: Payer: Self-pay | Admitting: Adult Health

## 2015-02-15 VITALS — BP 110/82 | HR 76 | Ht 66.75 in | Wt 168.0 lb

## 2015-02-15 DIAGNOSIS — Z1151 Encounter for screening for human papillomavirus (HPV): Secondary | ICD-10-CM | POA: Diagnosis not present

## 2015-02-15 DIAGNOSIS — Z79890 Hormone replacement therapy (postmenopausal): Secondary | ICD-10-CM

## 2015-02-15 DIAGNOSIS — R232 Flushing: Secondary | ICD-10-CM

## 2015-02-15 DIAGNOSIS — Z01419 Encounter for gynecological examination (general) (routine) without abnormal findings: Secondary | ICD-10-CM | POA: Insufficient documentation

## 2015-02-15 DIAGNOSIS — Z1212 Encounter for screening for malignant neoplasm of rectum: Secondary | ICD-10-CM | POA: Diagnosis not present

## 2015-02-15 DIAGNOSIS — Z780 Asymptomatic menopausal state: Secondary | ICD-10-CM

## 2015-02-15 DIAGNOSIS — R4589 Other symptoms and signs involving emotional state: Secondary | ICD-10-CM

## 2015-02-15 HISTORY — DX: Other symptoms and signs involving emotional state: R45.89

## 2015-02-15 HISTORY — DX: Flushing: R23.2

## 2015-02-15 HISTORY — DX: Hormone replacement therapy: Z79.890

## 2015-02-15 HISTORY — DX: Asymptomatic menopausal state: Z78.0

## 2015-02-15 LAB — HEMOCCULT GUIAC POC 1CARD (OFFICE): Fecal Occult Blood, POC: NEGATIVE

## 2015-02-15 MED ORDER — NORETHINDRONE-ETH ESTRADIOL 0.5-2.5 MG-MCG PO TABS: 1.0000 | ORAL_TABLET | Freq: Every day | ORAL | Status: DC

## 2015-02-15 NOTE — Patient Instructions (Signed)
Hormone Therapy At menopause, your body begins making less estrogen and progesterone hormones. This causes the body to stop having menstrual periods. This is because estrogen and progesterone hormones control your periods and menstrual cycle. A lack of estrogen may cause symptoms such as:  Hot flushes (or hot flashes).  Vaginal dryness.  Dry skin.  Loss of sex drive.  Risk of bone loss (osteoporosis). When this happens, you may choose to take hormone therapy to get back the estrogen lost during menopause. When the hormone estrogen is given alone, it is usually referred to as ET (Estrogen Therapy). When the hormone progestin is combined with estrogen, it is generally called HT (Hormone Therapy). This was formerly known as hormone replacement therapy (HRT). Your caregiver can help you make a decision on what will be best for you. The decision to use HT seems to change often as new studies are done. Many studies do not agree on the benefits of hormone replacement therapy. LIKELY BENEFITS OF HT INCLUDE PROTECTION FROM:  Hot Flushes (also called hot flashes) - A hot flush is a sudden feeling of heat that spreads over the face and body. The skin may redden like a blush. It is connected with sweats and sleep disturbance. Women going through menopause may have hot flushes a few times a month or several times per day depending on the woman.  Osteoporosis (bone loss)- Estrogen helps guard against bone loss. After menopause, a woman's bones slowly lose calcium and become weak and brittle. As a result, bones are more likely to break. The hip, wrist, and spine are affected most often. Hormone therapy can help slow bone loss after menopause. Weight bearing exercise and taking calcium with vitamin D also can help prevent bone loss. There are also medications that your caregiver can prescribe that can help prevent osteoporosis.  Vaginal Dryness - Loss of estrogen causes changes in the vagina. Its lining may  become thin and dry. These changes can cause pain and bleeding during sexual intercourse. Dryness can also lead to infections. This can cause burning and itching. (Vaginal estrogen treatment can help relieve pain, itching, and dryness.)  Urinary Tract Infections are more common after menopause because of lack of estrogen. Some women also develop urinary incontinence because of low estrogen levels in the vagina and bladder.  Possible other benefits of estrogen include a positive effect on mood and short-term memory in women. RISKS AND COMPLICATIONS  Using estrogen alone without progesterone causes the lining of the uterus to grow. This increases the risk of lining of the uterus (endometrial) cancer. Your caregiver should give another hormone called progestin if you have a uterus.  Women who take combined (estrogen and progestin) HT appear to have an increased risk of breast cancer. The risk appears to be small, but increases throughout the time that HT is taken.  Combined therapy also makes the breast tissue slightly denser which makes it harder to read mammograms (breast X-rays).  Combined, estrogen and progesterone therapy can be taken together every day, in which case there may be spotting of blood. HT therapy can be taken cyclically in which case you will have menstrual periods. Cyclically means HT is taken for a set amount of days, then not taken, then this process is repeated.  HT may increase the risk of stroke, heart attack, breast cancer and forming blood clots in your leg.  Transdermal estrogen (estrogen that is absorbed through the skin with a patch or a cream) may have more positive results with:    Cholesterol.  Blood pressure.  Blood clots. Having the following conditions may indicate you should not have HT:  Endometrial cancer.  Liver disease.  Breast cancer.  Heart disease.  History of blood clots.  Stroke. TREATMENT   If you choose to take HT and have a uterus,  usually estrogen and progestin are prescribed.  Your caregiver will help you decide the best way to take the medications.  Possible ways to take estrogen include:  Pills.  Patches.  Gels.  Sprays.  Vaginal estrogen cream, rings and tablets.  It is best to take the lowest dose possible that will help your symptoms and take them for the shortest period of time that you can.  Hormone therapy can help relieve some of the problems (symptoms) that affect women at menopause. Before making a decision about HT, talk to your caregiver about what is best for you. Be well informed and comfortable with your decisions. HOME CARE INSTRUCTIONS   Follow your caregivers advice when taking the medications.  A Pap test is done to screen for cervical cancer.  The first Pap test should be done at age 21.  Between ages 21 and 29, Pap tests are repeated every 2 years.  Beginning at age 30, you are advised to have a Pap test every 3 years as long as your past 3 Pap tests have been normal.  Some women have medical problems that increase the chance of getting cervical cancer. Talk to your caregiver about these problems. It is especially important to talk to your caregiver if a new problem develops soon after your last Pap test. In these cases, your caregiver may recommend more frequent screening and Pap tests.  The above recommendations are the same for women who have or have not gotten the vaccine for HPV (Human Papillomavirus).  If you had a hysterectomy for a problem that was not a cancer or a condition that could lead to cancer, then you no longer need Pap tests. However, even if you no longer need a Pap test, a regular exam is a good idea to make sure no other problems are starting.   If you are between ages 65 and 70, and you have had normal Pap tests going back 10 years, you no longer need Pap tests. However, even if you no longer need a Pap test, a regular exam is a good idea to make sure no  other problems are starting.   If you have had past treatment for cervical cancer or a condition that could lead to cancer, you need Pap tests and screening for cancer for at least 20 years after your treatment.  If Pap tests have been discontinued, risk factors (such as a new sexual partner) need to be re-assessed to determine if screening should be resumed.  Some women may need screenings more often if they are at high risk for cervical cancer.  Get mammograms done as per the advice of your caregiver. SEEK IMMEDIATE MEDICAL CARE IF:  You develop abnormal vaginal bleeding.  You have pain or swelling in your legs, shortness of breath, or chest pain.  You develop dizziness or headaches.  You have lumps or changes in your breasts or armpits.  You have slurred speech.  You develop weakness or numbness of your arms or legs.  You have pain, burning, or bleeding when urinating.  You develop abdominal pain. Document Released: 01/27/2003 Document Revised: 07/23/2011 Document Reviewed: 05/17/2010 [employer] Patient Information 2015 [employer]. This information is not intended to   replace advice given to you by your health care provider. Make sure you discuss any questions you have with your health care provider. Ethinyl Estradiol; Norethindrone Acetate (estrogen replacement) What is this medicine? ETHINYL ESTRADIOL; NORETHINDRONE ACETATE (ETh in il es tra DYE ole; nor eth IN drone AS e tate) is used as hormone replacement in menopausal women who still have their uterus. This product helps to treat hot flashes and prevent osteoporosis (weak bones). This medicine may be used for other purposes; ask your health care provider or pharmacist if you have questions. COMMON BRAND NAME(S): femhrt 1/5, Jevantique, Jinteli 1/5 What should I tell my health care provider before I take this medicine? They need to know if you have any of these conditions: -blood vessel disease or blood  clots -breast, cervical, endometrial, or uterine cancer -diabetes -endometriosis -fibroids -gallbladder disease -heart disease or recent heart attack -high blood cholesterol -high blood pressure -high level of calcium in the blood -hysterectomy -kidney disease -liver disease -mental depression -migraine headaches -porphyria -systemic lupus erythematosus (SLE) -tobacco smoker -stroke -vaginal bleeding -an unusual or allergic reaction to estrogens, progestins, other medicines, foods, dyes, or preservatives -pregnant or trying to get pregnant -breast-feeding How should I use this medicine? Take this medicine by mouth with a drink of water. You may take this medicine with food. Follow the directions on the prescription label. You will take one tablet daily at roughly the same time each day. Do not take your medicine more often than directed. Talk to your pediatrician regarding the use of this medicine in children. Special care may be needed. A patient package insert for the product will be given with each prescription and refill. Read this sheet carefully each time. The sheet may change frequently. Overdosage: If you think you have taken too much of this medicine contact a poison control center or emergency room at once. NOTE: This medicine is only for you. Do not share this medicine with others. What if I miss a dose? If you miss a dose, take it as soon as you can. If it is almost time for your next dose, take only that dose. Do not take double or extra doses. What may interact with this medicine? -barbiturates, such as phenobarbital -benzodiazepines -bosentan -bromocriptine -carbamazepine -cimetidine -cyclosporine -dantrolene -grapefruit juice -griseofulvin -hydrocortisone, cortisone, or prednisolone -isoniazid (INH) -medications for diabetes -methotrexate -mineral oil -phenytoin -raloxifene -rifabutin, rifampin, or rifapentine -tamoxifen -thyroid  hormones -topiramate -tricyclic antidepressants -warfarin This list may not describe all possible interactions. Give your health care provider a list of all the medicines, herbs, non-prescription drugs, or dietary supplements you use. Also tell them if you smoke, drink alcohol, or use illegal drugs. Some items may interact with your medicine. What should I watch for while using this medicine? Visit your health care professional for regular checks on your progress. You should have a complete check-up every 6 months. You will need a regular breast and pelvic exam. You should also discuss the need for regular mammograms with your health care professional, and follow his or her guidelines. This medicine can make your body retain fluid, making your fingers, hands, or ankles swell. Your blood pressure can go up. Contact your doctor or health care professional if you feel you are retaining fluid. If you have any reason to think you are pregnant; stop taking this medicine at once and contact your doctor or health care professional. Tobacco smoking increases the risk of getting a blood clot or having a stroke, especially if you are  more than 51 years old. You are strongly advised not to smoke. If you wear contact lenses and notice visual changes, or if the lenses begin to feel uncomfortable, consult your eye care specialist. If you are going to have elective surgery, you may need to stop taking this medicine beforehand. Consult your health care professional for advice prior to scheduling the surgery. What side effects may I notice from receiving this medicine? Side effects that you should report to your doctor or health care professional as soon as possible: -breakthrough bleeding and spotting -breast enlargement, tenderness, or discharge -chest pain -leg, arm, or groin pain -severe headaches -stomach or abdominal pain (severe) -sudden shortness of breath -swelling of the hands, feet or ankles, or rapid  weight gain -vaginal yeast infection (irritation and white discharge) -vision or speech problems -yellowing of the eyes or skin Side effects that usually do not require medical attention (report to your doctor or health care professional if they continue or are bothersome): -change in appetite -change in sexual desire -mild stomach upset -mood changes, anxiety, depression, frustration, anger, or emotional outbursts -nausea -skin rash, acne, or brown spots on the face -tiredness -weight gain This list may not describe all possible side effects. Call your doctor for medical advice about side effects. You may report side effects to FDA at 1-800-FDA-1088. Where should I keep my medicine? Keep out of the reach of children. Store at room temperature between 15 and 30 degrees C (59 and 86 degrees F). Throw away any unused medicine after the expiration date. NOTE: This sheet is a summary. It may not cover all possible information. If you have questions about this medicine, talk to your doctor, pharmacist, or health care provider.  2015, Elsevier/Gold Standard. (2008-04-28 16:51:14) Follow up in 3 months Physical in 1 year Colonoscopy advised Get mammogram

## 2015-02-15 NOTE — Progress Notes (Signed)
Patient ID: [name] [name], female   DOB: September 08, 1965, 49 y.o.   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 For pap and physicals.  PCP is Dr Gerarda Fraction.   Current Medications, Allergies, Past Medical History, Past Surgical History, Family History and Social History were reviewed in [facility] record.     Review of Systems: Patient denies any headaches, hearing loss, fatigue, blurred vision, shortness of breath, chest pain, abdominal pain, problems with bowel movements, urination, or intercourse(not having sex). No joint pain or mood swings. See HPI for positives.    Physical Exam:BP 110/82 mmHg  Pulse 76  Ht 5' 6.75" (1.695 m)  Wt 168 lb (76.204 kg)  BMI 26.52 kg/m2 General:  Well developed, well nourished, no acute distress Skin:  Warm and dry Neck:  Midline trachea, normal thyroid, good ROM, no lymphadenopathy Lungs; Clear to auscultation bilaterally Breast:  No dominant palpable mass, retraction, or nipple discharge Cardiovascular: Regular rate and rhythm Abdomen:  Soft, non tender, no hepatosplenomegaly Pelvic:  External genitalia is normal in appearance, no lesions, but has vitiligo..  The vagina is normal in appearance. Urethra has no lesions or masses. The cervix is smooth and tiny, pap with HPV performed.  Uterus is felt to be normal size, shape, and contour.  No adnexal masses or tenderness noted.Bladder is non tender, no masses felt. Rectal: Good sphincter tone, no polyps, or hemorrhoids felt.  Hemoccult negative. Extremities/musculoskeletal:  No swelling or varicosities noted, no clubbing or cyanosis, ?plaque psoriasis both ankles to see dermatologist  Psych:  No mood changes, alert and cooperative,seems happy Discussed  HRT, risk and benefits and she wants to try them.  Impression: Well woman gyn exam with pap Hot flashes [place] Postmenopausal HRT    Plan: Rx femhrt low dose #30 take 1 daily with 3 refills Return in 3 months for follow up  Physical in 1 year Mammogram yearly Colonoscopy advised Labs with PCP Review handout on HRT and femhrt

## 2015-02-17 LAB — CYTOLOGY - PAP

## 2015-03-15 ENCOUNTER — Other Ambulatory Visit: Payer: Self-pay | Admitting: Obstetrics and Gynecology

## 2015-03-15 DIAGNOSIS — Z1231 Encounter for screening mammogram for malignant neoplasm of breast: Secondary | ICD-10-CM

## 2015-03-17 ENCOUNTER — Telehealth: Payer: Self-pay | Admitting: Adult Health

## 2015-03-17 MED ORDER — ESTRADIOL 1 MG PO TABS: 1.0000 mg | ORAL_TABLET | Freq: Every day | ORAL | Status: DC

## 2015-03-17 MED ORDER — PROGESTERONE MICRONIZED 200 MG PO CAPS: ORAL_CAPSULE | ORAL | Status: DC

## 2015-03-17 NOTE — Telephone Encounter (Signed)
Will D/C femhrt,costs too much, and Rx estrace 1 mg 1 daily #30 with 3 refills and Prometrium 200 mg #30 1 at bedtime daily with 3 refills

## 2015-03-17 NOTE — Telephone Encounter (Signed)
Spoke with pt. Pt states the hormone med is $80.00. Can you prescribe something cheaper? Thanks!!! [employer]

## 2015-04-21 ENCOUNTER — Other Ambulatory Visit: Payer: Self-pay | Admitting: Physician Assistant

## 2015-04-25 ENCOUNTER — Telehealth: Payer: Self-pay | Admitting: Adult Health

## 2015-04-25 NOTE — Telephone Encounter (Signed)
Will continue current meds, it is about $50 per month, has had 2 yeast infection try 7 day monistat  And she agrees, she is feeling better

## 2015-04-25 NOTE — Telephone Encounter (Signed)
Spoke with pt. Pt was put on Estradiol and Progesterone on 03/17/15. They are working great but they are causing yeast infections. Please advise. Can you prescribe something different that's not expensive? Thanks!! [place]

## 2015-05-02 ENCOUNTER — Ambulatory Visit (HOSPITAL_COMMUNITY)
Admission: RE | Admit: 2015-05-02 | Discharge: 2015-05-02 | Disposition: A | Discharge status: Home / Self Care | Payer: 59 | Source: Ambulatory Visit | Attending: Obstetrics and Gynecology | Admitting: Obstetrics and Gynecology

## 2015-05-02 DIAGNOSIS — Z1231 Encounter for screening mammogram for malignant neoplasm of breast: Secondary | ICD-10-CM | POA: Diagnosis not present

## 2015-05-04 ENCOUNTER — Other Ambulatory Visit: Payer: Self-pay | Admitting: Obstetrics and Gynecology

## 2015-05-04 DIAGNOSIS — R928 Other abnormal and inconclusive findings on diagnostic imaging of breast: Secondary | ICD-10-CM

## 2015-05-13 ENCOUNTER — Ambulatory Visit
Admission: RE | Admit: 2015-05-13 | Discharge: 2015-05-13 | Disposition: A | Discharge status: Home / Self Care | Payer: 59 | Source: Ambulatory Visit | Attending: Obstetrics and Gynecology | Admitting: Obstetrics and Gynecology

## 2015-05-13 DIAGNOSIS — R928 Other abnormal and inconclusive findings on diagnostic imaging of breast: Secondary | ICD-10-CM

## 2015-05-31 ENCOUNTER — Telehealth: Payer: Self-pay | Admitting: *Deleted

## 2015-05-31 NOTE — Telephone Encounter (Signed)
Saw dermatologist has Lichen sclerosus, on ankles wants bottom check appt 1/27 at 12 N

## 2015-06-07 ENCOUNTER — Encounter: Payer: Self-pay | Admitting: Adult Health

## 2015-06-07 ENCOUNTER — Ambulatory Visit (INDEPENDENT_AMBULATORY_CARE_PROVIDER_SITE_OTHER): Payer: BLUE CROSS/BLUE SHIELD | Admitting: Adult Health

## 2015-06-07 VITALS — BP 112/70 | HR 73 | Ht 66.0 in | Wt 170.0 lb

## 2015-06-07 DIAGNOSIS — L900 Lichen sclerosus et atrophicus: Secondary | ICD-10-CM

## 2015-06-07 DIAGNOSIS — Z79890 Hormone replacement therapy (postmenopausal): Secondary | ICD-10-CM

## 2015-06-07 HISTORY — DX: Lichen sclerosus et atrophicus: L90.0

## 2015-06-07 MED ORDER — CLOBETASOL PROPIONATE 0.05 % EX CREA: 1.0000 "application " | TOPICAL_CREAM | Freq: Two times a day (BID) | CUTANEOUS | Status: DC

## 2015-06-07 MED ORDER — PROGESTERONE MICRONIZED 200 MG PO CAPS
ORAL_CAPSULE | ORAL | Status: DC
Start: 2015-06-07 — End: 2017-07-25

## 2015-06-07 MED ORDER — ESTRADIOL 1 MG PO TABS: 1.0000 mg | ORAL_TABLET | Freq: Every day | ORAL | Status: DC

## 2015-06-07 MED ORDER — ACYCLOVIR 400 MG PO TABS: 400.0000 mg | ORAL_TABLET | Freq: Three times a day (TID) | ORAL | Status: DC

## 2015-06-07 NOTE — Patient Instructions (Signed)
Lichen Sclerosus Lichen sclerosus is a skin problem. It can happen on any part of the body, but it commonly involves the anal or genital areas. It can cause itching and discomfort in these areas. Treatment can help to control symptoms. When the genital area is affected, getting treatment is important because the condition can cause scarring that may lead to other problems. CAUSES The cause of this condition is not known. It could be the result of an overactive immune system or a lack of certain hormones. Lichen sclerosus is not an infection or a fungus. It is not passed from one person to another (not contagious). RISK FACTORS This condition is more likely to develop in women, usually after menopause. SYMPTOMS Symptoms of this condition include:  Thin, wrinkled, white areas on the skin.  Thickened white areas on the skin.  Red and swollen patches (lesions) on the skin.  Tears or cracks in the skin.  Bruising.  Blood blisters.  Severe itching. You may also have pain, itching, or burning with urination. Constipation is also common in people with lichen sclerosus. DIAGNOSIS This condition may be diagnosed with a physical exam. In some cases, a tissue sample (biopsy sample) may be removed to be looked at under a microscope. TREATMENT This condition is usually treated with medicated creams or ointments (topical steroids) that are applied over the affected areas. HOME CARE INSTRUCTIONS  Take over-the-counter and prescription medicines only as told by your health care provider.  Use creams or ointments as told by your health care provider.  Do not scratch the affected areas of skin.  Women should keep the vaginal area as clean and dry as possible.  Keep all follow-up visits as told by your health care provider. This is important. SEEK MEDICAL CARE IF:  You have increasing redness, swelling, or pain in the affected area.  You have fluid, blood, or pus coming from the affected  area.  You have new lesions on your skin.  You have pain or burning with urination.  You have pain during sex.   This information is not intended to replace advice given to you by your health care provider. Make sure you discuss any questions you have with your health care provider.   Document Released: 09/20/2010 Document Revised: 01/19/2015 Document Reviewed: 07/26/2014 Elsevier Interactive Patient Education 2016 [place]. Use temovate bid Follow up in 4 weeks

## 2015-06-07 NOTE — Progress Notes (Signed)
Subjective:     Patient ID: [name] [name], female   DOB: 1965-06-27, 50 y.o.   MRN: [id number]  HPI [name] [name] is a 50 year old white female in for follow up of starting HRT and she says she is much better, but has itching and burning like when had herpes, but dermatologist told her to be check for lichen sclerosus.She has been uncomfortable in labia area for about 3 weeks.   Review of Systems Patient denies any headaches, hearing loss, fatigue, blurred vision, shortness of breath, chest pain, abdominal pain, problems with bowel movements, urination, or intercourse(Not having sex). No joint pain or mood swings.See HPI for positives. Reviewed past medical,surgical, social and family history. Reviewed medications and allergies.     Objective:   Physical Exam BP 112/70 mmHg  Pulse 73  Ht 5\' 6"  (1.676 m)  Wt 170 lb (77.111 kg)  BMI 27.45 kg/m2   Skin warm and dry.Pelvic: external genitalia has thinning of skin and white areas, with some excoriation near clitoris, and  Vagina:has decreased color, moisture and rugae,urethra has no lesions or masses noted, cervix:smooth, uterus: normal size, shape and contour, non tender, no masses felt, adnexa: no masses or tenderness noted. Bladder is non tender and no masses felt.Has biopsy areas on feet and inner thighs, has lichen sclerosus there.Dr Elonda Husky in for co exam.She is aware it is chronic and that she needs to stop smoking. Face time 15 minutes with 50 % counseling about lichen sclerosus.  Assessment:     Lichen sclerosus HRT    Plan:    Try to stop smoking Follow up in 4 weeks Rx temovate 0.05% cream use bid, 60 gm with 6 refills Refilled acyclovir 400 mg  #21 take tid when needed with prn refill    Refilled Prometrium 200 mg #30 take 1 daily with 6 refills Refilled estradiol 1 mg #30 take 1 daily with 6 refills Review handout on lichen sclerosus

## 2015-06-10 ENCOUNTER — Ambulatory Visit: Payer: Self-pay | Admitting: Adult Health

## 2015-07-05 ENCOUNTER — Ambulatory Visit: Payer: BLUE CROSS/BLUE SHIELD | Admitting: Adult Health

## 2015-11-03 IMAGING — MG MM DIGITAL SCREENING
5 series · 5 of 5 positions shown · non-contrast
Comparison: Previous exam(s).

CLINICAL DATA: Screening.

EXAM:
DIGITAL SCREENING BILATERAL MAMMOGRAM WITH CAD

[L CC]
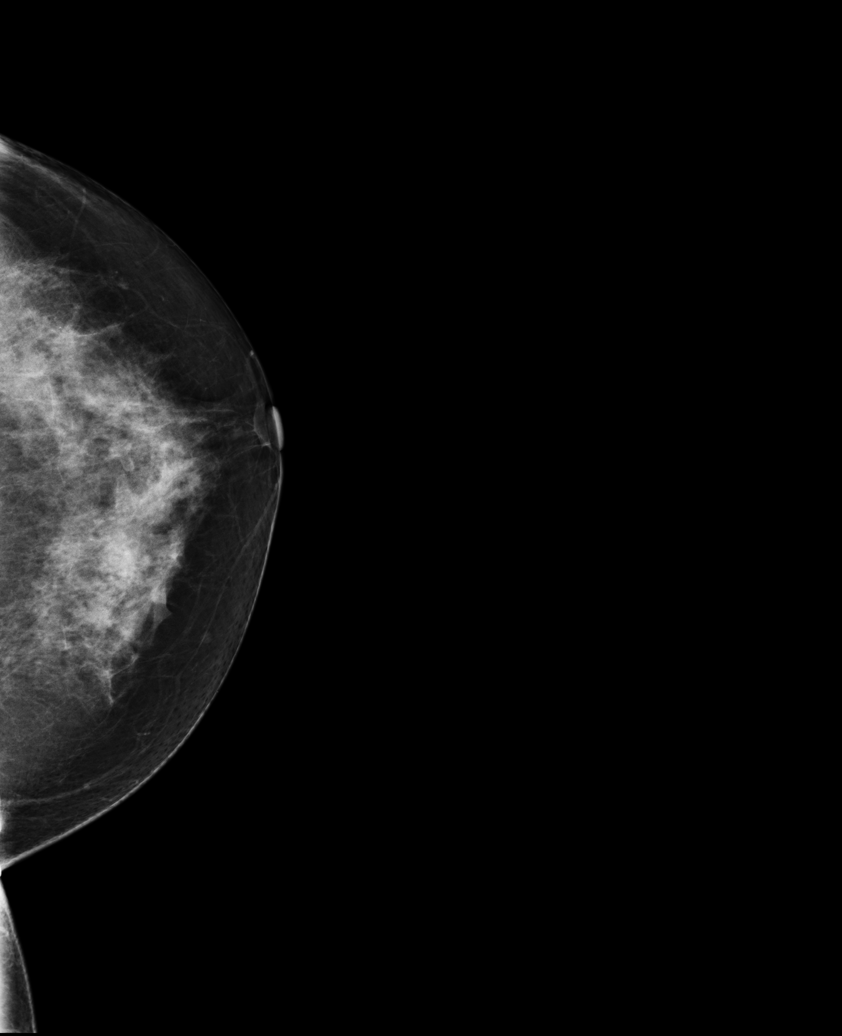

[L MLO (1 of 2)]
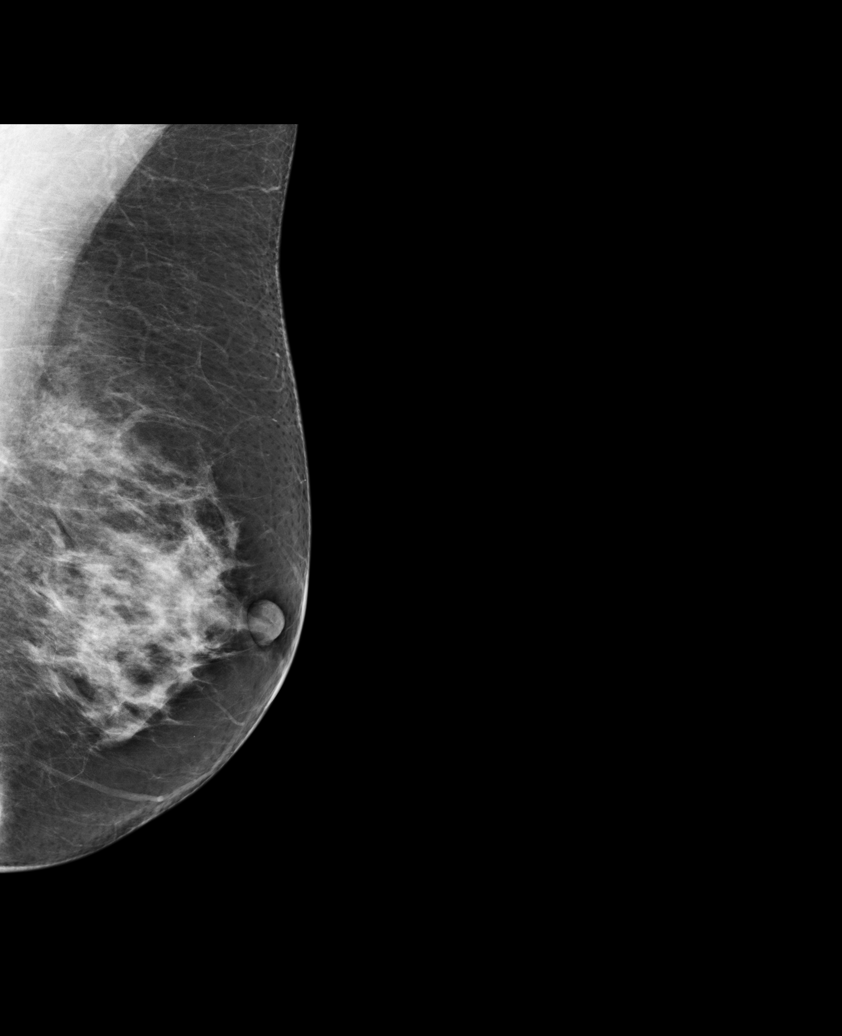

[R CC]
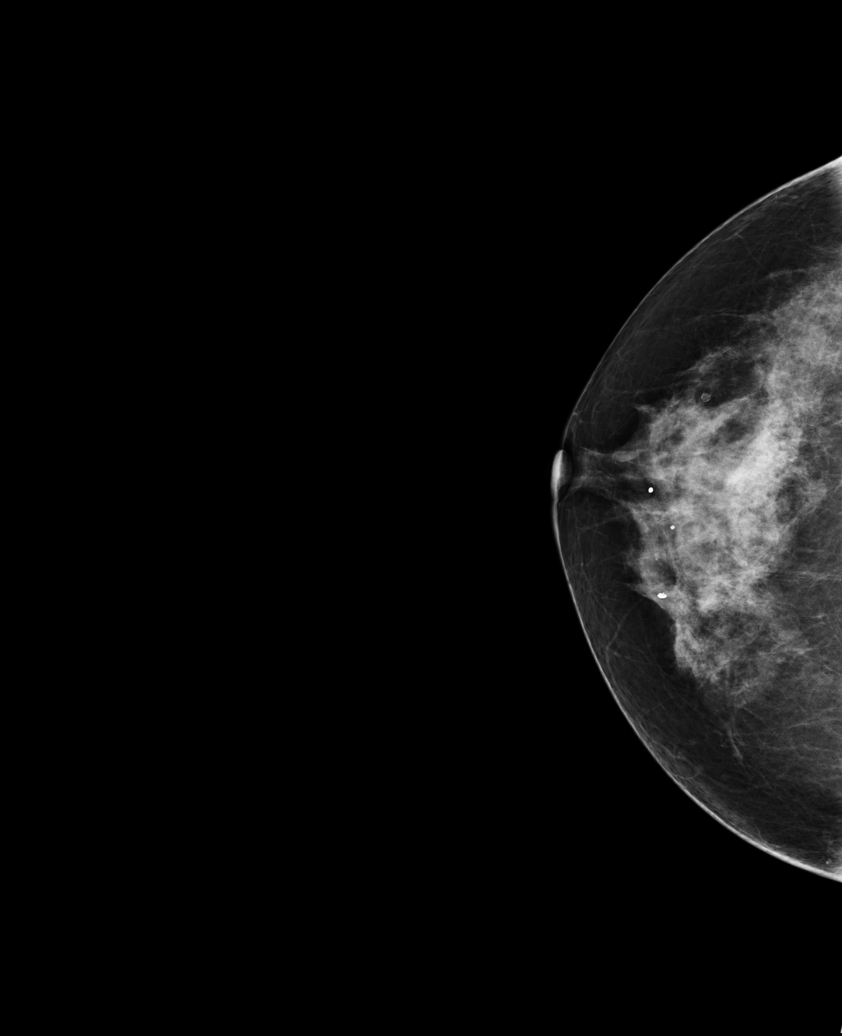

[R MLO]
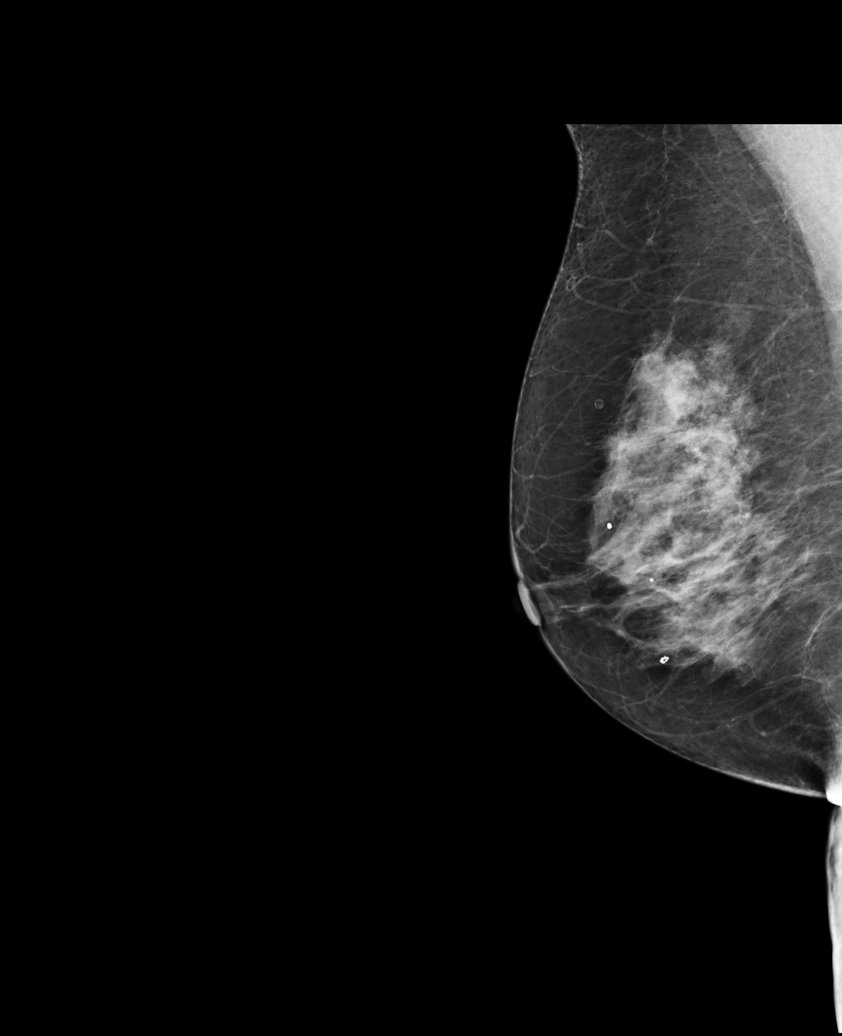

[L MLO (2 of 2)]
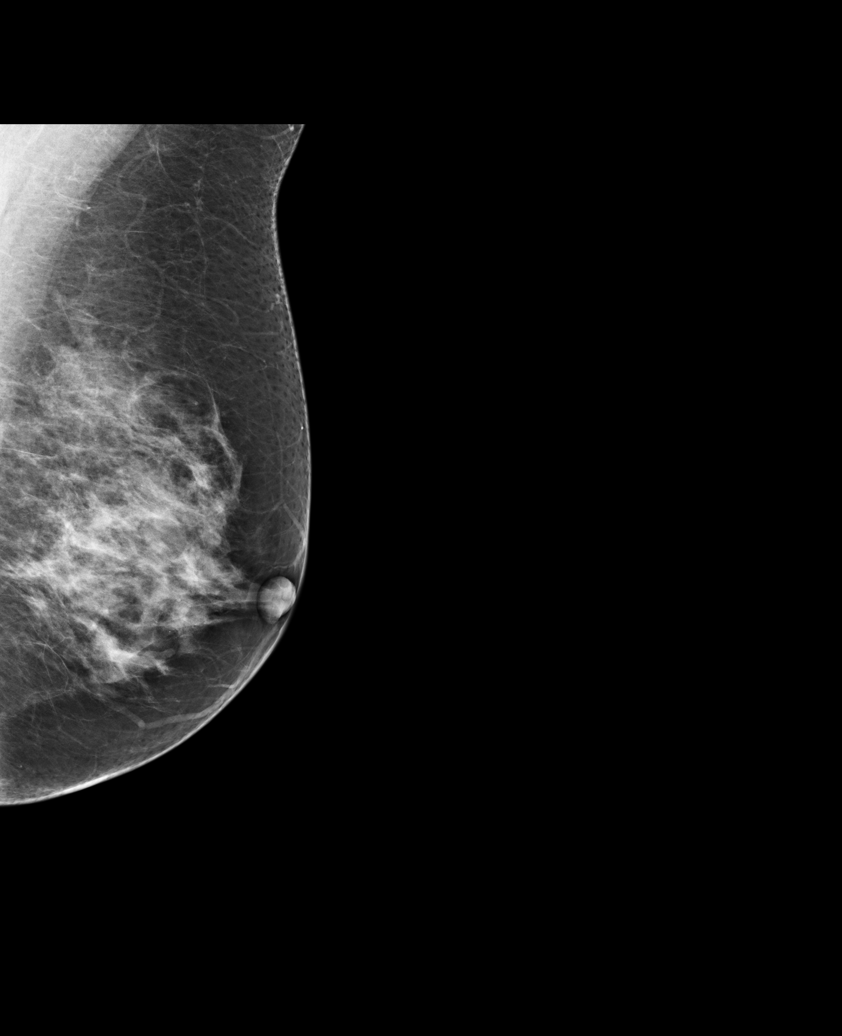

[5 of 5 positions shown; findings below may reference images not displayed]

ACR Breast Density Category c: The breast tissue is heterogeneously
dense, which may obscure small masses.
FINDINGS: There are no findings suspicious for malignancy. Images were
processed with CAD.
IMPRESSION: No mammographic evidence of malignancy. A result letter of this
screening mammogram will be mailed directly to the patient.

RECOMMENDATION:
Screening mammogram in one year. (Code:YJ-2-FEZ)

BI-RADS CATEGORY  1: Negative.

## 2016-07-17 ENCOUNTER — Other Ambulatory Visit (HOSPITAL_COMMUNITY): Payer: Self-pay | Admitting: *Deleted

## 2016-07-17 DIAGNOSIS — Z1231 Encounter for screening mammogram for malignant neoplasm of breast: Secondary | ICD-10-CM

## 2016-07-30 ENCOUNTER — Ambulatory Visit (HOSPITAL_COMMUNITY)
Admission: RE | Admit: 2016-07-30 | Discharge: 2016-07-30 | Disposition: A | Discharge status: Home / Self Care | Payer: Self-pay | Source: Ambulatory Visit | Attending: *Deleted | Admitting: *Deleted

## 2016-07-30 DIAGNOSIS — Z1231 Encounter for screening mammogram for malignant neoplasm of breast: Secondary | ICD-10-CM

## 2016-07-31 ENCOUNTER — Other Ambulatory Visit (HOSPITAL_COMMUNITY): Payer: Self-pay | Admitting: *Deleted

## 2016-07-31 DIAGNOSIS — IMO0002 Reserved for concepts with insufficient information to code with codable children: Secondary | ICD-10-CM

## 2016-07-31 DIAGNOSIS — R229 Localized swelling, mass and lump, unspecified: Principal | ICD-10-CM

## 2016-08-21 ENCOUNTER — Ambulatory Visit (HOSPITAL_COMMUNITY)
Admission: RE | Admit: 2016-08-21 | Discharge: 2016-08-21 | Disposition: A | Discharge status: Home / Self Care | Payer: PRIVATE HEALTH INSURANCE | Source: Ambulatory Visit | Attending: *Deleted | Admitting: *Deleted

## 2016-08-21 DIAGNOSIS — R229 Localized swelling, mass and lump, unspecified: Secondary | ICD-10-CM | POA: Insufficient documentation

## 2016-08-21 DIAGNOSIS — IMO0002 Reserved for concepts with insufficient information to code with codable children: Secondary | ICD-10-CM

## 2016-10-01 ENCOUNTER — Encounter (HOSPITAL_COMMUNITY): Payer: Self-pay | Admitting: Emergency Medicine

## 2016-10-01 ENCOUNTER — Emergency Department (HOSPITAL_COMMUNITY)
Admission: EM | Admit: 2016-10-01 | Discharge: 2016-10-01 | Disposition: A | Discharge status: Home Health | Payer: Self-pay | Attending: Emergency Medicine | Admitting: Emergency Medicine

## 2016-10-01 DIAGNOSIS — Y939 Activity, unspecified: Secondary | ICD-10-CM | POA: Insufficient documentation

## 2016-10-01 DIAGNOSIS — S0006XA Insect bite (nonvenomous) of scalp, initial encounter: Secondary | ICD-10-CM | POA: Insufficient documentation

## 2016-10-01 DIAGNOSIS — F17210 Nicotine dependence, cigarettes, uncomplicated: Secondary | ICD-10-CM | POA: Insufficient documentation

## 2016-10-01 DIAGNOSIS — W57XXXA Bitten or stung by nonvenomous insect and other nonvenomous arthropods, initial encounter: Secondary | ICD-10-CM | POA: Insufficient documentation

## 2016-10-01 DIAGNOSIS — Y929 Unspecified place or not applicable: Secondary | ICD-10-CM | POA: Insufficient documentation

## 2016-10-01 DIAGNOSIS — Y999 Unspecified external cause status: Secondary | ICD-10-CM | POA: Insufficient documentation

## 2016-10-01 MED ORDER — TRIAMCINOLONE ACETONIDE 0.1 % EX CREA: 1.0000 "application " | TOPICAL_CREAM | Freq: Three times a day (TID) | CUTANEOUS | 0 refills | Status: DC

## 2016-10-01 MED ORDER — DOXYCYCLINE HYCLATE 100 MG PO CAPS: 100.0000 mg | ORAL_CAPSULE | Freq: Two times a day (BID) | ORAL | 0 refills | Status: DC

## 2016-10-01 NOTE — ED Provider Notes (Signed)
[facility] DEPT Provider Note   CSN: [id number] Arrival date & time: 10/01/16  1153   By signing my name below, I, Eunice Blase, attest that this documentation has been prepared under the direction and in the presence of Eira Alpert, PA-C . Electronically Signed: Eunice Blase, Scribe. 10/01/16. 2:51 PM.   History   Chief Complaint Chief Complaint  Patient presents with  . Insect Bite   The history is provided by the patient and medical records. No language interpreter was used.    [name] [name] is a 51 y.o. female who presents to the Emergency Department with concern for an insect bite to the back of the head she noticed 3 days ago. Hot/cold chills noted yesterday; pt has a raised bump to the affected area. She describes 4/10 fluctuating soreness worse with contact. Pt has attempted to treat with hydrogen oxide unsuccessfully. No other modifying factors noted. No drainage, neck pain, joint pain, fever, chills,N/V noted. No other complaints at this time.  Past Medical History:  Diagnosis Date  . Anxiety   . GERD (gastroesophageal reflux disease)   . Hormone replacement therapy (HRT) 02/15/2015  . Hot flashes 02/15/2015  . HSV (herpes simplex virus) infection   . Hyperlipidemia   . Lichen sclerosus 9/98/3382  . Moody 02/15/2015  . Postmenopausal 02/15/2015  . Vitiligo     Patient Active Problem List   Diagnosis Date Noted  . Lichen sclerosus 50/53/9767  . Hot flashes 02/15/2015  . Moody 02/15/2015  . Postmenopausal 02/15/2015  . Hormone replacement therapy (HRT) 02/15/2015  . Right knee pain 05/21/2013  . Meniscal cyst 05/21/2013  . Derangement of posterior horn of medial meniscus 05/21/2013  . Arthritis of right knee 05/21/2013    Past Surgical History:  Procedure Laterality Date  . ECTOPIC PREGNANCY SURGERY    . FINGER SURGERY    . TONSILLECTOMY    . TUBAL LIGATION      OB History    Gravida Para Term Preterm AB Living   2       1     SAB TAB Ectopic  Multiple Live Births   1               Home Medications    Prior to Admission medications   Medication Sig Start Date End Date Taking? Authorizing Provider  acyclovir (ZOVIRAX) 400 MG tablet Take 1 tablet (400 mg total) by mouth 3 (three) times daily. 06/07/15   Estill Dooms, NP  ALPRAZolam Duanne Moron) 1 MG tablet Take 1 mg by mouth at bedtime as needed. For sleep    [provider]  citalopram (CELEXA) 40 MG tablet Take 40 mg by mouth daily.    [provider]  clobetasol cream (TEMOVATE) 3.41 % Apply 1 application topically 2 (two) times daily. 06/07/15   Estill Dooms, NP  estradiol (ESTRACE) 1 MG tablet Take 1 tablet (1 mg total) by mouth daily. 06/07/15   Estill Dooms, NP  ibuprofen (ADVIL,MOTRIN) 200 MG tablet Take 400 mg by mouth every 6 (six) hours as needed for fever, mild pain or moderate pain.     [provider]  Omega-3 Fatty Acids (FISH OIL PO) Take by mouth. Takes 3 daily    [provider]  Omeprazole (PRILOSEC PO) Take by mouth as needed.    [provider]  progesterone (PROMETRIUM) 200 MG capsule Take 1 at bedtime daily 06/07/15   Derrek Monaco A, NP  tacrolimus (PROTOPIC) 0.1 % ointment Apply topically  2 (two) times daily.    [provider]  UNABLE TO FIND Joint med-takes prn    [provider]    Family History Family History  Problem Relation Age of Onset  . Heart attack Mother   . Other Sister        dysplasia  . Alcohol abuse Brother   . Leukemia Maternal Grandmother        rare form    Social History Social History  Substance Use Topics  . Smoking status: Current Every Day Smoker    Packs/day: 1.00    Years: 35.00    Types: Cigarettes  . Smokeless tobacco: Never Used  . Alcohol use Yes     Comment: socially     Allergies   Patient has no known allergies.   Review of Systems Review of Systems  Constitutional: Negative for activity change, appetite change,  chills, fatigue and fever.  HENT: Negative for facial swelling, sore throat and trouble swallowing.   Respiratory: Negative for chest tightness, shortness of breath and wheezing.   Gastrointestinal: Negative for diarrhea, nausea and vomiting.  Musculoskeletal: Negative for neck pain and neck stiffness.  Skin: Positive for color change. Negative for rash and wound.       Insect bite scalp  Neurological: Negative for dizziness, weakness, light-headedness, numbness and headaches.  All other systems reviewed and are negative.    Physical Exam Updated Vital Signs BP 126/78 (BP Location: Right Arm)   Pulse 84   Temp (!) 96.6 F (35.9 C) (Temporal)   Resp 17   Ht 5\' 6"  (1.676 m)   Wt 170 lb (77.1 kg)   SpO2 94%   BMI 27.44 kg/m   Physical Exam  Constitutional: She is oriented to person, place, and time. She appears well-developed and well-nourished. No distress.  HENT:  Head: Normocephalic and atraumatic.  Mouth/Throat: Oropharynx is clear and moist.  Neck: Normal range of motion. Neck supple.  Cardiovascular: Normal rate, regular rhythm, normal heart sounds and intact distal pulses.   No murmur heard. Pulmonary/Chest: Effort normal and breath sounds normal. No respiratory distress.  Abdominal: She exhibits no distension.  Musculoskeletal: Normal range of motion. She exhibits no edema or tenderness.  Lymphadenopathy:    She has no cervical adenopathy.  Neurological: She is alert and oriented to person, place, and time. She exhibits normal muscle tone. Coordination normal.  Skin: Skin is warm. Rash noted. There is erythema.  2 cm slightly raised lesion to the posterior scalp, small central pustule present without induration.  Psychiatric: She has a normal mood and affect.  Nursing note and vitals reviewed.    ED Treatments / Results  DIAGNOSTIC STUDIES: Oxygen Saturation is 94% on RA, adequate by my interpretation.    COORDINATION OF CARE: 2:48 PM-Discussed next steps with  pt. Pt verbalized understanding and is agreeable with the plan. Pt prepared for d/c, advised of symptomatic care at home and return precautions.    Labs (all labs ordered are listed, but only abnormal results are displayed) Labs Reviewed - No data to display  EKG  EKG Interpretation None       Radiology No results found.  Procedures Procedures (including critical care time)  Medications Ordered in ED Medications - No data to display   Initial Impression / Assessment and Plan / ED Course  I have reviewed the triage vital signs and the nursing notes.  Pertinent labs & imaging results that were available during my care of the patient were  reviewed by me and considered in my medical decision making (see chart for details).     Small pustule to posterior scalp.  No surrounding erythema.  No tick was seen.  No abscess.  Pt agrees to warm wet compresses and abx.  Return precautions discussed.   Final Clinical Impressions(s) / ED Diagnoses   Final diagnoses:  Insect bite, initial encounter    New Prescriptions New Prescriptions   No medications on file   I personally performed the services described in this documentation, which was scribed in my presence. The recorded information has been reviewed and is accurate.    Kem Parkinson, PA-C 10/06/16 1645    Milton Ferguson, MD 10/10/16 1255

## 2016-10-01 NOTE — ED Triage Notes (Signed)
Patient complaining of insect bite to back of head. Small area noted to lower back of head. No drainage or swelling noted.

## 2016-10-01 NOTE — Discharge Instructions (Signed)
Warm wet compresses to the affected area.  OTC benadryl one capsule every 4-6 hrs if needed for itching.  Follow-up with your doctor for recheck.  Return here for any worsening symptoms such as fever, increasing redness or rash

## 2017-02-06 ENCOUNTER — Other Ambulatory Visit (HOSPITAL_COMMUNITY): Payer: Self-pay | Admitting: Internal Medicine

## 2017-02-06 DIAGNOSIS — R928 Other abnormal and inconclusive findings on diagnostic imaging of breast: Secondary | ICD-10-CM

## 2017-07-25 ENCOUNTER — Encounter: Payer: Self-pay | Admitting: Physician Assistant

## 2017-07-25 ENCOUNTER — Ambulatory Visit: Payer: Self-pay | Admitting: Physician Assistant

## 2017-07-25 VITALS — BP 122/74 | HR 88 | Temp 97.7°F | Ht 66.5 in | Wt 176.0 lb

## 2017-07-25 DIAGNOSIS — Z131 Encounter for screening for diabetes mellitus: Secondary | ICD-10-CM

## 2017-07-25 DIAGNOSIS — F39 Unspecified mood [affective] disorder: Secondary | ICD-10-CM

## 2017-07-25 DIAGNOSIS — Z7689 Persons encountering health services in other specified circumstances: Secondary | ICD-10-CM

## 2017-07-25 DIAGNOSIS — Z13220 Encounter for screening for lipoid disorders: Secondary | ICD-10-CM

## 2017-07-25 DIAGNOSIS — Z1239 Encounter for other screening for malignant neoplasm of breast: Secondary | ICD-10-CM

## 2017-07-25 DIAGNOSIS — F17210 Nicotine dependence, cigarettes, uncomplicated: Secondary | ICD-10-CM

## 2017-07-25 DIAGNOSIS — L900 Lichen sclerosus et atrophicus: Secondary | ICD-10-CM

## 2017-07-25 NOTE — Progress Notes (Signed)
BP 122/74 (BP Location: Left Arm, Patient Position: Sitting, Cuff Size: Normal)   Pulse 88   Temp 97.7 F (36.5 C) (Other (Comment))   Ht 5' 6.5" (1.689 m)   Wt 176 lb (79.8 kg)   SpO2 97%   BMI 27.98 kg/m    Subjective:    Patient ID: [name] [name], female    DOB: July 27, 1965, 52 y.o.   MRN: [id number]  HPI: [name] [name] is a 52 y.o. female presenting on 07/25/2017 for New Patient (Initial Visit)   HPI   Pt says she Has [facility] but it's too expensive to continue.    She doesn't have insurance.  She works part-time as a Presenter, broadcasting.   Pt wants something for her rash.  Says she had biopsy in the past and was diagnosed with lichen sclerosis.  Says she was given a cream but it didn't help.   Relevant past medical, surgical, family and social history reviewed and updated as indicated. Interim medical history since our last visit reviewed. Allergies and medications reviewed and updated.   Current Outpatient Medications:  .  acyclovir (ZOVIRAX) 400 MG tablet, Take 1 tablet (400 mg total) by mouth 3 (three) times daily., Disp: 21 tablet, Rfl: prn .  ALPRAZolam (XANAX) 1 MG tablet, Take 1 mg by mouth at bedtime as needed. For sleep, Disp: , Rfl:  .  aspirin 325 MG tablet, Take 325 mg by mouth daily as needed., Disp: , Rfl:  .  citalopram (CELEXA) 40 MG tablet, Take 40 mg by mouth daily., Disp: , Rfl:  .  esomeprazole (NEXIUM) 20 MG capsule, Take 20 mg by mouth daily at 12 noon., Disp: , Rfl:  .  ibuprofen (ADVIL,MOTRIN) 200 MG tablet, Take 400 mg by mouth every 6 (six) hours as needed for fever, mild pain or moderate pain. , Disp: , Rfl:  .  vitamin C (ASCORBIC ACID) 500 MG tablet, Take 500 mg by mouth daily., Disp: , Rfl:    Review of Systems  Constitutional: Positive for fatigue. Negative for appetite change, chills, diaphoresis, fever and unexpected weight change.  HENT: Positive for voice change. Negative for congestion, dental problem, drooling, ear  pain, facial swelling, hearing loss, mouth sores, sneezing, sore throat and trouble swallowing.   Eyes: Positive for pain and itching. Negative for discharge, redness and visual disturbance.  Respiratory: Negative for cough, choking, shortness of breath and wheezing.   Cardiovascular: Negative for chest pain, palpitations and leg swelling.  Gastrointestinal: Negative for abdominal pain, blood in stool, constipation, diarrhea and vomiting.  Endocrine: Negative for cold intolerance, heat intolerance and polydipsia.  Genitourinary: Negative for decreased urine volume, dysuria and hematuria.  Musculoskeletal: Negative for arthralgias, back pain and gait problem.  Skin: Positive for rash.  Allergic/Immunologic: Positive for environmental allergies.  Neurological: Positive for headaches. Negative for seizures, syncope and light-headedness.  Hematological: Negative for adenopathy.  Psychiatric/Behavioral: Positive for dysphoric mood. Negative for agitation and suicidal ideas. The patient is nervous/anxious.     Per HPI unless specifically indicated above     Objective:    BP 122/74 (BP Location: Left Arm, Patient Position: Sitting, Cuff Size: Normal)   Pulse 88   Temp 97.7 F (36.5 C) (Other (Comment))   Ht 5' 6.5" (1.689 m)   Wt 176 lb (79.8 kg)   SpO2 97%   BMI 27.98 kg/m   Wt Readings from Last 3 Encounters:  07/25/17 176 lb (79.8 kg)  10/01/16 170 lb (  77.1 kg)  06/07/15 170 lb (77.1 kg)    Physical Exam  Constitutional: She is oriented to person, place, and time. She appears well-developed and well-nourished.  HENT:  Head: Normocephalic and atraumatic.  Mouth/Throat: Oropharynx is clear and moist. No oropharyngeal exudate.  Eyes: Conjunctivae and EOM are normal. Pupils are equal, round, and reactive to light.  Neck: Neck supple. No thyromegaly present.  Cardiovascular: Normal rate and regular rhythm.  Pulmonary/Chest: Effort normal and breath sounds normal.  Abdominal: Soft.  Bowel sounds are normal. She exhibits no mass. There is no hepatosplenomegaly. There is no tenderness.  Musculoskeletal: She exhibits no edema.  Lymphadenopathy:    She has no cervical adenopathy.  Neurological: She is alert and oriented to person, place, and time. Gait normal.  Skin: Skin is warm and dry.  Psychiatric: She has a normal mood and affect. Her behavior is normal.  Vitals reviewed.       Assessment & Plan:    Encounter Diagnoses  Name Primary?  . Encounter to establish care Yes  . Mood disorder ([place])   . Lichen sclerosus   . Screening cholesterol level   . Cigarette nicotine dependence without complication   . Screening for diabetes mellitus   . Screening for breast cancer     -ordered screening Mammogram -record request sent to [facility] to get guiac results -PAP good until 2021 -will refer To Dermatologist for lichen sclerosis after receiving old records from previous dermatologist -will get Baseline labs -referred to [facility] for MH/anxiety -counseled smoking cessation -pt to follow up 1 month.  RTO sooner prn

## 2017-08-01 ENCOUNTER — Other Ambulatory Visit: Payer: Self-pay | Admitting: *Deleted

## 2017-08-01 DIAGNOSIS — Z1231 Encounter for screening mammogram for malignant neoplasm of breast: Secondary | ICD-10-CM

## 2017-08-08 ENCOUNTER — Other Ambulatory Visit (HOSPITAL_COMMUNITY)
Admission: RE | Admit: 2017-08-08 | Discharge: 2017-08-08 | Disposition: A | Discharge status: Home / Self Care | Payer: Self-pay | Source: Ambulatory Visit | Attending: Physician Assistant | Admitting: Physician Assistant

## 2017-08-08 DIAGNOSIS — Z131 Encounter for screening for diabetes mellitus: Secondary | ICD-10-CM

## 2017-08-08 DIAGNOSIS — F39 Unspecified mood [affective] disorder: Secondary | ICD-10-CM

## 2017-08-08 DIAGNOSIS — Z13220 Encounter for screening for lipoid disorders: Secondary | ICD-10-CM

## 2017-08-08 LAB — COMPREHENSIVE METABOLIC PANEL
ALK PHOS: 72 U/L (ref 38–126)
ALT: 56 U/L — ABNORMAL HIGH (ref 14–54)
ANION GAP: 11 (ref 5–15)
AST: 29 U/L (ref 15–41)
Albumin: 4.1 g/dL (ref 3.5–5.0)
BILIRUBIN TOTAL: 0.8 mg/dL (ref 0.3–1.2)
BUN: 13 mg/dL (ref 6–20)
CALCIUM: 9.2 mg/dL (ref 8.9–10.3)
CO2: 27 mmol/L (ref 22–32)
Chloride: 101 mmol/L (ref 101–111)
Creatinine, Ser: 0.57 mg/dL (ref 0.44–1.00)
GFR calc Af Amer: >60 mL/min (ref >60)
GFR calc non Af Amer: >60 mL/min (ref >60)
GLUCOSE: 102 mg/dL — AB (ref 65–99)
POTASSIUM: 4.2 mmol/L (ref 3.5–5.1)
SODIUM: 139 mmol/L (ref 135–145)
TOTAL PROTEIN: 7.1 g/dL (ref 6.5–8.1)

## 2017-08-08 LAB — LIPID PANEL
CHOL/HDL RATIO: 6.1 ratio
Cholesterol: 281 mg/dL — ABNORMAL HIGH (ref 0–200)
HDL: 46 mg/dL (ref >40)
LDL CALC: 183 mg/dL — AB (ref 0–99)
Triglycerides: 262 mg/dL — ABNORMAL HIGH (ref <150)
VLDL: 52 mg/dL — ABNORMAL HIGH (ref 0–40)

## 2017-08-08 LAB — HEMOGLOBIN A1C
Hgb A1c MFr Bld: 5.6 % (ref 4.8–5.6)
Mean Plasma Glucose: 114.02 mg/dL

## 2017-08-08 LAB — TSH: TSH: 4.635 u[IU]/mL — ABNORMAL HIGH (ref 0.350–4.500)

## 2017-08-20 ENCOUNTER — Encounter: Payer: Self-pay | Admitting: Physician Assistant

## 2017-08-27 ENCOUNTER — Encounter: Payer: Self-pay | Admitting: Physician Assistant

## 2017-08-27 ENCOUNTER — Ambulatory Visit: Payer: Self-pay | Admitting: Physician Assistant

## 2017-08-27 VITALS — BP 120/76 | HR 81 | Temp 97.9°F | Ht 66.5 in | Wt 180.8 lb

## 2017-08-27 DIAGNOSIS — L900 Lichen sclerosus et atrophicus: Secondary | ICD-10-CM

## 2017-08-27 DIAGNOSIS — F39 Unspecified mood [affective] disorder: Secondary | ICD-10-CM

## 2017-08-27 DIAGNOSIS — F17210 Nicotine dependence, cigarettes, uncomplicated: Secondary | ICD-10-CM

## 2017-08-27 DIAGNOSIS — L249 Irritant contact dermatitis, unspecified cause: Secondary | ICD-10-CM

## 2017-08-27 DIAGNOSIS — E785 Hyperlipidemia, unspecified: Secondary | ICD-10-CM

## 2017-08-27 MED ORDER — ATORVASTATIN CALCIUM 20 MG PO TABS: 20.0000 mg | ORAL_TABLET | Freq: Every day | ORAL | 1 refills | Status: DC

## 2017-08-27 NOTE — Patient Instructions (Signed)

## 2017-08-27 NOTE — Progress Notes (Signed)
BP 120/76 (BP Location: Left Arm, Patient Position: Sitting, Cuff Size: Normal)   Pulse 81   Temp 97.9 F (36.6 C)   Ht 5' 6.5" (1.689 m)   Wt 180 lb 12 oz (82 kg)   SpO2 97%   BMI 28.74 kg/m    Subjective:    Patient ID: [name] [name], female    DOB: [birth year], 52 y.o.   MRN: [id number]  HPI: [name] [name] is a 52 y.o. female presenting on 08/27/2017 for Follow-up (pt states she is trying CBD oil for anxiety and sleep to try to get off xanax)   HPI   Chief Complaint  Patient presents with  . Follow-up    pt states she is trying CBD oil for anxiety and sleep to try to get off xanax    Got previous derm report with biopsy showing lichen sclerosis.  Pt c/o problems with it on B wrists  Pt went to [facility] and has appointment for follow/up  Pt complains of sore skin L shoulder where it gets irritated by her work shirt.    Relevant past medical, surgical, family and social history reviewed and updated as indicated. Interi medical history since our last visit reviewed. Allergies and medications reviewed and updated.   Current Outpatient Medications:  .  acyclovir (ZOVIRAX) 400 MG tablet, Take 1 tablet (400 mg total) by mouth 3 (three) times daily., Disp: 21 tablet, Rfl: prn .  ALPRAZolam (XANAX) 1 MG tablet, Take 1 mg by mouth at bedtime as needed. For sleep, Disp: , Rfl:  .  aspirin 325 MG tablet, Take 325 mg by mouth daily as needed., Disp: , Rfl:  .  citalopram (CELEXA) 40 MG tablet, Take 40 mg by mouth daily., Disp: , Rfl:  .  esomeprazole (NEXIUM) 20 MG capsule, Take 20 mg by mouth daily at 12 noon., Disp: , Rfl:  .  ibuprofen (ADVIL,MOTRIN) 200 MG tablet, Take 400 mg by mouth every 6 (six) hours as needed for fever, mild pain or moderate pain. , Disp: , Rfl:  .  vitamin C (ASCORBIC ACID) 500 MG tablet, Take 500 mg by mouth daily., Disp: , Rfl:    Review of Systems  Constitutional: Positive for fatigue. Negative for appetite change, chills, diaphoresis, fever and  unexpected weight change.  HENT: Negative for congestion, dental problem, drooling, ear pain, facial swelling, hearing loss, mouth sores, sneezing, sore throat, trouble swallowing and voice change.   Eyes: Positive for itching. Negative for pain, discharge, redness and visual disturbance.  Respiratory: Negative for cough, choking, shortness of breath and wheezing.   Cardiovascular: Negative for chest pain, palpitations and leg swelling.  Gastrointestinal: Negative for abdominal pain, blood in stool, constipation, diarrhea and vomiting.  Endocrine: Negative for cold intolerance, heat intolerance and polydipsia.  Genitourinary: Negative for decreased urine volume, dysuria and hematuria.  Musculoskeletal: Negative for arthralgias, back pain and gait problem.  Skin: Positive for rash.  Allergic/Immunologic: Positive for environmental allergies.  Neurological: Negative for seizures, syncope, light-headedness and headaches.  Hematological: Negative for adenopathy.  Psychiatric/Behavioral: Positive for agitation. Negative for dysphoric mood and suicidal ideas. The patient is nervous/anxious.     Per HPI unless specifically indicated above     Objective:    BP 120/76 (BP Location: Left Arm, Patient Position: Sitting, Cuff Size: Normal)   Pulse 81   Temp 97.9 F (36.6 C)   Ht 5' 6.5" (1.689 m)   Wt 180 lb 12 oz (82 kg)   SpO2 97%  BMI 28.74 kg/m   Wt Readings from Last 3 Encounters:  08/27/17 180 lb 12 oz (82 kg)  07/25/17 176 lb (79.8 kg)  10/01/16 170 lb (77.1 kg)    Physical Exam  Constitutional: She is oriented to person, place, and time. She appears well-developed and well-nourished.  HENT:  Head: Normocephalic and atraumatic.  Neck: Neck supple.  Cardiovascular: Normal rate and regular rhythm.  Pulmonary/Chest: Effort normal and breath sounds normal.  Abdominal: Soft. Bowel sounds are normal. She exhibits no mass. There is no hepatosplenomegaly. There is no tenderness.    Musculoskeletal: She exhibits no edema.  Lymphadenopathy:    She has no cervical adenopathy.  Neurological: She is alert and oriented to person, place, and time.  Skin: Skin is warm and dry. Rash noted.     Red bumpy area of skin L shoulder area  Psychiatric: She has a normal mood and affect. Her behavior is normal.  Vitals reviewed.   Results for orders placed or performed during the hospital encounter of 08/08/17  TSH  Result Value Ref Range   TSH 4.635 (H) 0.350 - 4.500 uIU/mL  Hemoglobin A1c  Result Value Ref Range   Hgb A1c MFr Bld 5.6 4.8 - 5.6 %   Mean Plasma Glucose 114.02 mg/dL  Comprehensive metabolic panel  Result Value Ref Range   Sodium 139 135 - 145 mmol/L   Potassium 4.2 3.5 - 5.1 mmol/L   Chloride 101 101 - 111 mmol/L   CO2 27 22 - 32 mmol/L   Glucose, Bld 102 (H) 65 - 99 mg/dL   BUN 13 6 - 20 mg/dL   Creatinine, Ser 0.57 0.44 - 1.00 mg/dL   Calcium 9.2 8.9 - 10.3 mg/dL   Total Protein 7.1 6.5 - 8.1 g/dL   Albumin 4.1 3.5 - 5.0 g/dL   AST 29 15 - 41 U/L   ALT 56 (H) 14 - 54 U/L   Alkaline Phosphatase 72 38 - 126 U/L   Total Bilirubin 0.8 0.3 - 1.2 mg/dL   GFR calc non Af Amer >60 >60 mL/min   GFR calc Af Amer >60 >60 mL/min   Anion gap 11 5 - 15  Lipid panel  Result Value Ref Range   Cholesterol 281 (H) 0 - 200 mg/dL   Triglycerides 262 (H) <150 mg/dL   HDL 46 >40 mg/dL   Total CHOL/HDL Ratio 6.1 RATIO   VLDL 52 (H) 0 - 40 mg/dL   LDL Cholesterol 183 (H) 0 - 99 mg/dL      Assessment & Plan:   Encounter Diagnoses  Name Primary?  . Mood disorder ([place]) Yes  . Lichen sclerosus   . Hyperlipidemia, unspecified hyperlipidemia type   . Cigarette nicotine dependence without complication   . Irritant dermatitis     -reviewed labs with pt -Refer to derm for lichen sclerosis -pt awaiting call to schedule mammogram that was ordered at last OV -will sign up pt for medassist for atorvastatin.  Pt counseled on lowfat diet to help lipids as  well -counseled pt on Smoking cessation -recommended pt try wearing Undershirt under work shirt -pt to follow up in 4 months (1 month to get atorvastatin from medassist and 3 months to be on the med).  Pt to RTO sooner prn

## 2017-09-17 ENCOUNTER — Telehealth: Payer: Self-pay | Admitting: Student

## 2017-09-17 NOTE — Telephone Encounter (Signed)
Pt called stating that her 2nd and 3rd fingers on her L hand were bruised. Pt states it looks like blood vessels burst. Pt is concerned that her cholesterol medication may be the cause of this.  PA advised for patient to wait out a week to see if there is improvement. Pt is to call the office for scheduling after the week if she is still concerned about her fingers.  Pt was notified and verbalizes understanding.

## 2017-10-08 ENCOUNTER — Encounter: Payer: Self-pay | Admitting: Physician Assistant

## 2017-12-03 ENCOUNTER — Other Ambulatory Visit: Payer: Self-pay | Admitting: *Deleted

## 2017-12-03 ENCOUNTER — Other Ambulatory Visit (HOSPITAL_COMMUNITY): Payer: Self-pay | Admitting: *Deleted

## 2017-12-03 DIAGNOSIS — Z1231 Encounter for screening mammogram for malignant neoplasm of breast: Secondary | ICD-10-CM

## 2017-12-12 ENCOUNTER — Ambulatory Visit (HOSPITAL_COMMUNITY)
Admission: RE | Admit: 2017-12-12 | Discharge: 2017-12-12 | Disposition: A | Discharge status: Home / Self Care | Payer: PRIVATE HEALTH INSURANCE | Source: Ambulatory Visit | Attending: *Deleted | Admitting: *Deleted

## 2017-12-12 ENCOUNTER — Encounter (HOSPITAL_COMMUNITY): Payer: Self-pay

## 2017-12-12 DIAGNOSIS — Z1231 Encounter for screening mammogram for malignant neoplasm of breast: Secondary | ICD-10-CM | POA: Diagnosis present

## 2017-12-25 ENCOUNTER — Other Ambulatory Visit (HOSPITAL_COMMUNITY)
Admission: RE | Admit: 2017-12-25 | Discharge: 2017-12-25 | Disposition: A | Discharge status: Home / Self Care | Payer: Self-pay | Source: Ambulatory Visit | Attending: Physician Assistant | Admitting: Physician Assistant

## 2017-12-25 ENCOUNTER — Ambulatory Visit: Payer: Self-pay | Admitting: Physician Assistant

## 2017-12-25 DIAGNOSIS — E785 Hyperlipidemia, unspecified: Secondary | ICD-10-CM | POA: Insufficient documentation

## 2017-12-25 LAB — COMPREHENSIVE METABOLIC PANEL
ALT: 28 U/L (ref 0–44)
AST: 21 U/L (ref 15–41)
Albumin: 3.9 g/dL (ref 3.5–5.0)
Alkaline Phosphatase: 90 U/L (ref 38–126)
Anion gap: 10 (ref 5–15)
BUN: 11 mg/dL (ref 6–20)
CHLORIDE: 103 mmol/L (ref 98–111)
CO2: 30 mmol/L (ref 22–32)
Calcium: 9 mg/dL (ref 8.9–10.3)
Creatinine, Ser: 0.6 mg/dL (ref 0.44–1.00)
GFR calc non Af Amer: >60 mL/min (ref >60)
Glucose, Bld: 109 mg/dL — ABNORMAL HIGH (ref 70–99)
POTASSIUM: 4 mmol/L (ref 3.5–5.1)
SODIUM: 143 mmol/L (ref 135–145)
Total Bilirubin: 1.2 mg/dL (ref 0.3–1.2)
Total Protein: 6.7 g/dL (ref 6.5–8.1)

## 2017-12-25 LAB — LIPID PANEL
Cholesterol: 166 mg/dL (ref 0–200)
HDL: 39 mg/dL — AB (ref >40)
LDL CALC: 90 mg/dL (ref 0–99)
Total CHOL/HDL Ratio: 4.3 RATIO
Triglycerides: 187 mg/dL — ABNORMAL HIGH (ref <150)
VLDL: 37 mg/dL (ref 0–40)

## 2018-01-01 ENCOUNTER — Encounter: Payer: Self-pay | Admitting: Physician Assistant

## 2018-01-01 ENCOUNTER — Ambulatory Visit: Payer: Self-pay | Admitting: Physician Assistant

## 2018-01-01 VITALS — BP 123/73 | HR 77 | Temp 97.7°F | Ht 66.5 in | Wt 175.0 lb

## 2018-01-01 DIAGNOSIS — F17210 Nicotine dependence, cigarettes, uncomplicated: Secondary | ICD-10-CM

## 2018-01-01 DIAGNOSIS — E785 Hyperlipidemia, unspecified: Secondary | ICD-10-CM

## 2018-01-01 DIAGNOSIS — B009 Herpesviral infection, unspecified: Secondary | ICD-10-CM

## 2018-01-01 DIAGNOSIS — F39 Unspecified mood [affective] disorder: Secondary | ICD-10-CM

## 2018-01-01 MED ORDER — ACYCLOVIR 400 MG PO TABS: 400.0000 mg | ORAL_TABLET | Freq: Three times a day (TID) | ORAL | 3 refills | Status: DC

## 2018-01-01 NOTE — Progress Notes (Signed)
BP 123/73 (BP Location: Right Arm, Patient Position: Sitting, Cuff Size: Normal)   Pulse 77   Temp 97.7 F (36.5 C) (Other (Comment))   Ht 5' 6.5" (1.689 m)   Wt 175 lb (79.4 kg)   SpO2 96%   BMI 27.82 kg/m    Subjective:    Patient ID: [name] [name], female    DOB: 11/13/1965, 52 y.o.   MRN: [id number]  HPI: [name] [name] is a 52 y.o. female presenting on 01/01/2018 for Hyperlipidemia   HPI  Pt is no longer going to [facility] because they would not give her xanax.  She is getting those from [facility].  She says they give her 3 months supply at a time  She has no new issues today.  She requests refill on acyclovir for intermittent herpes flares.   Relevant past medical, surgical, family and social history reviewed and updated as indicated. Interim medical history since our last visit reviewed. Allergies and medications reviewed and updated.   Current Outpatient Medications:  .  acyclovir (ZOVIRAX) 400 MG tablet, Take 1 tablet (400 mg total) by mouth 3 (three) times daily., Disp: 21 tablet, Rfl: prn .  ALPRAZolam (XANAX) 1 MG tablet, Take 1 mg by mouth at bedtime as needed. For sleep, Disp: , Rfl:  .  aspirin 325 MG tablet, Take 325 mg by mouth daily as needed., Disp: , Rfl:  .  atorvastatin (LIPITOR) 20 MG tablet, Take 1 tablet (20 mg total) by mouth daily., Disp: 90 tablet, Rfl: 1 .  citalopram (CELEXA) 40 MG tablet, Take 40 mg by mouth daily., Disp: , Rfl:  .  esomeprazole (NEXIUM) 20 MG capsule, Take 20 mg by mouth daily at 12 noon., Disp: , Rfl:  .  ibuprofen (ADVIL,MOTRIN) 200 MG tablet, Take 400 mg by mouth every 6 (six) hours as needed for fever, mild pain or moderate pain. , Disp: , Rfl:  .  vitamin C (ASCORBIC ACID) 500 MG tablet, Take 500 mg by mouth daily., Disp: , Rfl:    Review of Systems  Constitutional: Negative for appetite change, chills, diaphoresis, fatigue, fever and unexpected weight change.  HENT: Positive for congestion and sore throat. Negative for  dental problem, drooling, ear pain, facial swelling, hearing loss, mouth sores, sneezing, trouble swallowing and voice change.   Eyes: Negative for pain, discharge, redness, itching and visual disturbance.  Respiratory: Negative for cough, choking, shortness of breath and wheezing.   Cardiovascular: Negative for chest pain, palpitations and leg swelling.  Gastrointestinal: Negative for abdominal pain, blood in stool, constipation, diarrhea and vomiting.  Endocrine: Negative for cold intolerance, heat intolerance and polydipsia.  Genitourinary: Negative for decreased urine volume, dysuria and hematuria.  Musculoskeletal: Positive for arthralgias and back pain. Negative for gait problem.  Skin: Negative for rash.  Allergic/Immunologic: Positive for environmental allergies.  Neurological: Negative for seizures, syncope, light-headedness and headaches.  Hematological: Negative for adenopathy.  Psychiatric/Behavioral: Negative for agitation, dysphoric mood and suicidal ideas. The patient is not nervous/anxious.     Per HPI unless specifically indicated above     Objective:    BP 123/73 (BP Location: Right Arm, Patient Position: Sitting, Cuff Size: Normal)   Pulse 77   Temp 97.7 F (36.5 C) (Other (Comment))   Ht 5' 6.5" (1.689 m)   Wt 175 lb (79.4 kg)   SpO2 96%   BMI 27.82 kg/m   Wt Readings from Last 3 Encounters:  01/01/18 175 lb (79.4 kg)  08/27/17 180 lb 12  oz (82 kg)  07/25/17 176 lb (79.8 kg)    Physical Exam  Constitutional: She is oriented to person, place, and time. She appears well-developed and well-nourished.  HENT:  Head: Normocephalic and atraumatic.  Neck: Neck supple.  Cardiovascular: Normal rate and regular rhythm.  Pulmonary/Chest: Effort normal and breath sounds normal.  Abdominal: Soft. Bowel sounds are normal. She exhibits no mass. There is no hepatosplenomegaly. There is no tenderness.  Musculoskeletal: She exhibits no edema.  Lymphadenopathy:    She has  no cervical adenopathy.  Neurological: She is alert and oriented to person, place, and time.  Skin: Skin is warm and dry.  Psychiatric: She has a normal mood and affect. Her behavior is normal.  Vitals reviewed.   Results for orders placed or performed during the hospital encounter of 12/25/17  Lipid panel  Result Value Ref Range   Cholesterol 166 0 - 200 mg/dL   Triglycerides 187 (H) <150 mg/dL   HDL 39 (L) >40 mg/dL   Total CHOL/HDL Ratio 4.3 RATIO   VLDL 37 0 - 40 mg/dL   LDL Cholesterol 90 0 - 99 mg/dL  Comprehensive metabolic panel  Result Value Ref Range   Sodium 143 135 - 145 mmol/L   Potassium 4.0 3.5 - 5.1 mmol/L   Chloride 103 98 - 111 mmol/L   CO2 30 22 - 32 mmol/L   Glucose, Bld 109 (H) 70 - 99 mg/dL   BUN 11 6 - 20 mg/dL   Creatinine, Ser 0.60 0.44 - 1.00 mg/dL   Calcium 9.0 8.9 - 10.3 mg/dL   Total Protein 6.7 6.5 - 8.1 g/dL   Albumin 3.9 3.5 - 5.0 g/dL   AST 21 15 - 41 U/L   ALT 28 0 - 44 U/L   Alkaline Phosphatase 90 38 - 126 U/L   Total Bilirubin 1.2 0.3 - 1.2 mg/dL   GFR calc non Af Amer >60 >60 mL/min   GFR calc Af Amer >60 >60 mL/min   Anion gap 10 5 - 15      Assessment & Plan:   Encounter Diagnoses  Name Primary?  . Hyperlipidemia, unspecified hyperlipidemia type Yes  . Cigarette nicotine dependence without complication   . HSV infection     -reviewed lab with pt -pt to continue current medications -Pt to follow up 3 months.  RTO sooner prn

## 2018-02-17 ENCOUNTER — Other Ambulatory Visit: Payer: Self-pay | Admitting: Physician Assistant

## 2018-03-27 ENCOUNTER — Other Ambulatory Visit (HOSPITAL_COMMUNITY)
Admission: RE | Admit: 2018-03-27 | Discharge: 2018-03-27 | Disposition: A | Discharge status: Home / Self Care | Payer: Self-pay | Source: Ambulatory Visit | Attending: Physician Assistant | Admitting: Physician Assistant

## 2018-03-27 DIAGNOSIS — F39 Unspecified mood [affective] disorder: Secondary | ICD-10-CM | POA: Insufficient documentation

## 2018-03-27 DIAGNOSIS — E785 Hyperlipidemia, unspecified: Secondary | ICD-10-CM | POA: Insufficient documentation

## 2018-03-27 LAB — TSH: TSH: 4.699 u[IU]/mL — AB (ref 0.350–4.500)

## 2018-03-27 LAB — COMPREHENSIVE METABOLIC PANEL
ALK PHOS: 78 U/L (ref 38–126)
ALT: 28 U/L (ref 0–44)
ANION GAP: 7 (ref 5–15)
AST: 23 U/L (ref 15–41)
Albumin: 4.3 g/dL (ref 3.5–5.0)
BUN: 12 mg/dL (ref 6–20)
CALCIUM: 9 mg/dL (ref 8.9–10.3)
CO2: 28 mmol/L (ref 22–32)
CREATININE: 0.58 mg/dL (ref 0.44–1.00)
Chloride: 104 mmol/L (ref 98–111)
Glucose, Bld: 100 mg/dL — ABNORMAL HIGH (ref 70–99)
Potassium: 3.6 mmol/L (ref 3.5–5.1)
SODIUM: 139 mmol/L (ref 135–145)
Total Bilirubin: 0.9 mg/dL (ref 0.3–1.2)
Total Protein: 7.1 g/dL (ref 6.5–8.1)

## 2018-03-27 LAB — LIPID PANEL
Cholesterol: 171 mg/dL (ref 0–200)
HDL: 44 mg/dL (ref >40)
LDL Cholesterol: 94 mg/dL (ref 0–99)
TRIGLYCERIDES: 163 mg/dL — AB (ref <150)
Total CHOL/HDL Ratio: 3.9 RATIO
VLDL: 33 mg/dL (ref 0–40)

## 2018-04-01 ENCOUNTER — Other Ambulatory Visit: Payer: Self-pay | Admitting: Physician Assistant

## 2018-04-01 ENCOUNTER — Ambulatory Visit: Payer: Self-pay | Admitting: Physician Assistant

## 2018-04-01 ENCOUNTER — Encounter: Payer: Self-pay | Admitting: Physician Assistant

## 2018-04-01 VITALS — BP 115/61 | HR 72 | Temp 97.5°F | Ht 66.5 in | Wt 175.0 lb

## 2018-04-01 DIAGNOSIS — E785 Hyperlipidemia, unspecified: Secondary | ICD-10-CM

## 2018-04-01 DIAGNOSIS — Z1211 Encounter for screening for malignant neoplasm of colon: Secondary | ICD-10-CM

## 2018-04-01 DIAGNOSIS — F17200 Nicotine dependence, unspecified, uncomplicated: Secondary | ICD-10-CM

## 2018-04-01 NOTE — Progress Notes (Signed)
BP 115/61 (BP Location: Right Arm, Patient Position: Sitting, Cuff Size: Normal)   Pulse 72   Temp (!) 97.5 F (36.4 C)   Ht 5' 6.5" (1.689 m)   Wt 175 lb (79.4 kg)   SpO2 94%   BMI 27.82 kg/m    Subjective:    Patient ID: [name] [name], female    DOB: November 02, 1965, 52 y.o.   MRN: [id number]  HPI: [name] [name] is a 52 y.o. female presenting on 04/01/2018 for Hyperlipidemia   HPI   Pt says her PAP was updated when her mammo was  Relevant past medical, surgical, family and social history reviewed and updated as indicated. Interim medical history since our last visit reviewed. Allergies and medications reviewed and updated.   Current Outpatient Medications:  .  acyclovir (ZOVIRAX) 400 MG tablet, Take 1 tablet (400 mg total) by mouth 3 (three) times daily. For 5 days, Disp: 15 tablet, Rfl: 3 .  ALPRAZolam (XANAX) 1 MG tablet, Take 1 mg by mouth at bedtime as needed. For sleep, Disp: , Rfl:  .  aspirin 325 MG tablet, Take 325 mg by mouth daily as needed., Disp: , Rfl:  .  atorvastatin (LIPITOR) 20 MG tablet, TAKE 1 Tablet BY MOUTH ONCE DAILY, Disp: 90 tablet, Rfl: 1 .  citalopram (CELEXA) 40 MG tablet, Take 40 mg by mouth daily., Disp: , Rfl:  .  esomeprazole (NEXIUM) 20 MG capsule, Take 20 mg by mouth daily at 12 noon., Disp: , Rfl:  .  ibuprofen (ADVIL,MOTRIN) 200 MG tablet, Take 400 mg by mouth every 6 (six) hours as needed for fever, mild pain or moderate pain. , Disp: , Rfl:  .  vitamin C (ASCORBIC ACID) 500 MG tablet, Take 500 mg by mouth daily., Disp: , Rfl:    Review of Systems  Constitutional: Negative for appetite change, chills, diaphoresis, fatigue, fever and unexpected weight change.  HENT: Positive for sneezing. Negative for congestion, dental problem, drooling, ear pain, facial swelling, hearing loss, mouth sores, sore throat, trouble swallowing and voice change.   Eyes: Negative for pain, discharge, redness, itching and visual disturbance.  Respiratory: Negative for  cough, choking, shortness of breath and wheezing.   Cardiovascular: Negative for chest pain, palpitations and leg swelling.  Gastrointestinal: Negative for abdominal pain, blood in stool, constipation, diarrhea and vomiting.  Endocrine: Negative for cold intolerance, heat intolerance and polydipsia.  Genitourinary: Negative for decreased urine volume, dysuria and hematuria.  Musculoskeletal: Positive for arthralgias and back pain. Negative for gait problem.  Skin: Negative for rash.  Allergic/Immunologic: Positive for environmental allergies.  Neurological: Negative for seizures, syncope, light-headedness and headaches.  Hematological: Negative for adenopathy.  Psychiatric/Behavioral: Negative for agitation, dysphoric mood and suicidal ideas. The patient is not nervous/anxious.     Per HPI unless specifically indicated above     Objective:    BP 115/61 (BP Location: Right Arm, Patient Position: Sitting, Cuff Size: Normal)   Pulse 72   Temp (!) 97.5 F (36.4 C)   Ht 5' 6.5" (1.689 m)   Wt 175 lb (79.4 kg)   SpO2 94%   BMI 27.82 kg/m   Wt Readings from Last 3 Encounters:  04/01/18 175 lb (79.4 kg)  01/01/18 175 lb (79.4 kg)  08/27/17 180 lb 12 oz (82 kg)    Physical Exam  Constitutional: She is oriented to person, place, and time. She appears well-developed and well-nourished.  HENT:  Head: Normocephalic and atraumatic.  Neck: Neck supple.  Cardiovascular: Normal  rate and regular rhythm.  Pulmonary/Chest: Effort normal and breath sounds normal.  Abdominal: Soft. Bowel sounds are normal. She exhibits no mass. There is no hepatosplenomegaly. There is no tenderness.  Musculoskeletal: She exhibits no edema.  Lymphadenopathy:    She has no cervical adenopathy.  Neurological: She is alert and oriented to person, place, and time.  Skin: Skin is warm and dry.  Psychiatric: She has a normal mood and affect. Her behavior is normal.  Vitals reviewed.   Results for orders placed or  performed during the hospital encounter of 03/27/18  TSH  Result Value Ref Range   TSH 4.699 (H) 0.350 - 4.500 uIU/mL  Lipid panel  Result Value Ref Range   Cholesterol 171 0 - 200 mg/dL   Triglycerides 163 (H) <150 mg/dL   HDL 44 >40 mg/dL   Total CHOL/HDL Ratio 3.9 RATIO   VLDL 33 0 - 40 mg/dL   LDL Cholesterol 94 0 - 99 mg/dL  Comprehensive metabolic panel  Result Value Ref Range   Sodium 139 135 - 145 mmol/L   Potassium 3.6 3.5 - 5.1 mmol/L   Chloride 104 98 - 111 mmol/L   CO2 28 22 - 32 mmol/L   Glucose, Bld 100 (H) 70 - 99 mg/dL   BUN 12 6 - 20 mg/dL   Creatinine, Ser 0.58 0.44 - 1.00 mg/dL   Calcium 9.0 8.9 - 10.3 mg/dL   Total Protein 7.1 6.5 - 8.1 g/dL   Albumin 4.3 3.5 - 5.0 g/dL   AST 23 15 - 41 U/L   ALT 28 0 - 44 U/L   Alkaline Phosphatase 78 38 - 126 U/L   Total Bilirubin 0.9 0.3 - 1.2 mg/dL   GFR calc non Af Amer >60 >60 mL/min   GFR calc Af Amer >60 >60 mL/min   Anion gap 7 5 - 15      Assessment & Plan:    Encounter Diagnoses  Name Primary?  . Hyperlipidemia, unspecified hyperlipidemia type Yes  . Tobacco use disorder   . Screening for colon cancer     -reviewed labs with pt -pt to Continue current medication -pt was given iFOBT for colon cancer screening -cousneled smoking cessation -pt to follow up F/u 3 months.  RTO sooner prn

## 2018-07-02 ENCOUNTER — Ambulatory Visit: Payer: Self-pay | Admitting: Physician Assistant

## 2018-08-12 ENCOUNTER — Telehealth: Payer: Self-pay | Admitting: Physician Assistant

## 2018-09-24 ENCOUNTER — Telehealth: Payer: Self-pay | Admitting: Adult Health

## 2019-07-23 ENCOUNTER — Other Ambulatory Visit (HOSPITAL_COMMUNITY): Payer: Self-pay | Admitting: Family Medicine

## 2019-07-23 DIAGNOSIS — Z1231 Encounter for screening mammogram for malignant neoplasm of breast: Secondary | ICD-10-CM

## 2019-08-03 ENCOUNTER — Ambulatory Visit (HOSPITAL_COMMUNITY)
Admission: RE | Admit: 2019-08-03 | Discharge: 2019-08-03 | Disposition: A | Discharge status: Home / Self Care | Payer: 59 | Source: Ambulatory Visit | Attending: Family Medicine | Admitting: Family Medicine

## 2019-08-03 ENCOUNTER — Other Ambulatory Visit: Payer: Self-pay

## 2019-08-03 DIAGNOSIS — Z1231 Encounter for screening mammogram for malignant neoplasm of breast: Secondary | ICD-10-CM | POA: Diagnosis not present

## 2019-08-11 ENCOUNTER — Telehealth: Payer: Self-pay | Admitting: Adult Health

## 2019-08-11 NOTE — Telephone Encounter (Signed)

## 2019-08-13 ENCOUNTER — Ambulatory Visit: Payer: 59 | Admitting: Adult Health

## 2019-08-13 ENCOUNTER — Encounter: Payer: Self-pay | Admitting: Adult Health

## 2019-08-13 ENCOUNTER — Other Ambulatory Visit: Payer: Self-pay

## 2019-08-13 VITALS — BP 135/67 | HR 78 | Ht 66.75 in | Wt 177.0 lb

## 2019-08-13 DIAGNOSIS — R300 Dysuria: Secondary | ICD-10-CM | POA: Diagnosis not present

## 2019-08-13 DIAGNOSIS — L900 Lichen sclerosus et atrophicus: Secondary | ICD-10-CM

## 2019-08-13 DIAGNOSIS — N952 Postmenopausal atrophic vaginitis: Secondary | ICD-10-CM

## 2019-08-13 DIAGNOSIS — R319 Hematuria, unspecified: Secondary | ICD-10-CM | POA: Diagnosis not present

## 2019-08-13 LAB — POCT URINALYSIS DIPSTICK
Glucose, UA: NEGATIVE
Ketones, UA: NEGATIVE
Nitrite, UA: NEGATIVE
Protein, UA: POSITIVE — AB

## 2019-08-13 MED ORDER — CLOBETASOL PROPIONATE 0.05 % EX CREA: 1.0000 "application " | TOPICAL_CREAM | Freq: Two times a day (BID) | CUTANEOUS | 3 refills | Status: DC

## 2019-08-13 MED ORDER — ESTRADIOL 0.1 MG/GM VA CREA: TOPICAL_CREAM | VAGINAL | 1 refills | Status: DC

## 2019-08-13 MED ORDER — CLOBETASOL PROPIONATE 0.05 % EX CREA: 1.0000 "application " | TOPICAL_CREAM | Freq: Two times a day (BID) | CUTANEOUS | 0 refills | Status: DC

## 2019-08-13 NOTE — Progress Notes (Signed)
Patient ID: [name] [name], female   DOB: July 12, 1965, 54 y.o.   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 She says she had pap at health dept in 2019, did not have insurance then.  PCP is Dr Hilma Favors   Current Medications, Allergies, Past Medical History, Past Surgical History, Family History and Social History were reviewed in [facility] record.     Review of Systems: +burning with urination for 1 week Pain in vaginal area No sex in 3 years  +vaginal dryness, tried replens, no help    Physical Exam:BP 135/67 (BP Location: Left Arm, Patient Position: Sitting, Cuff Size: Normal)   Pulse 78   Ht 5' 6.75" (1.695 m)   Wt 177 lb (80.3 kg)   BMI 27.93 kg/m  urine dipstick trace leuks,trace protein and trace blood. General:  Well developed, well nourished, no acute distress Skin:  Warm and dry, has lichen sclerosus on arms and legs, had biopsy in 2016 at [facility] in [place]. Lungs; Clear to auscultation bilaterally Cardiovascular: Regular rate and rhythm Pelvic:  External genitalia has thin tissue with white area esp left labia and about clitoris , and has crack in skin, and the inner labia has darkened skin, bilaterally.  The vagina has thin red tissue, it is dry, and smooth, no rugae. Urethra has no lesions or masses. The cervix is atrophic.  Uterus is felt to be normal size, shape, and contour.  No adnexal masses or tenderness noted.Bladder is non tender, no masses felt. Psych:  No mood changes, alert and cooperative,seems happy Fall risk is moderate PHQ 9 score is 7, no SI is on celexa, she says still sad over losing brother Alcohol audit is 7, drinks wine.   Examination chaperoned by Levy Pupa LPN.  Impression and Plan:  1. Burning with urination UA C&S sent  2. Lichen sclerosus et atrophicus Will rx  temovate Meds ordered this encounter  Medications  . DISCONTD: clobetasol cream (TEMOVATE) 0.05 %    Sig: Apply 1 application topically 2 (two) times daily.    Dispense:  30 g    Refill:  0    Order Specific Question:   Supervising Provider    Answer:   Elonda Husky, LUTHER H [[id number]]  . clobetasol cream (TEMOVATE) 0.05 %    Sig: Apply 1 application topically 2 (two) times daily.    Dispense:  30 g    Refill:  3    Order Specific Question:   Supervising Provider    Answer:   EURE, LUTHER H [[id number]]  . estradiol (ESTRACE VAGINAL) 0.1 MG/GM vaginal cream    Sig: Use 0.5 gm in vagina bid for 2 weeks then 2 x weekly    Dispense:  42.5 g    Refill:  1    Order Specific Question:   Supervising Provider    Answer:   Florian Buff [[id number]]  Showed her pictures in [facility]   3. Vaginal atrophy Will rx estrace vaginal cream   4. Hematuria, unspecified type UA C&S sent Drink more water and less wine.

## 2019-08-16 LAB — URINALYSIS, ROUTINE W REFLEX MICROSCOPIC
Bilirubin, UA: NEGATIVE
Glucose, UA: NEGATIVE
Leukocytes,UA: NEGATIVE
Nitrite, UA: NEGATIVE
RBC, UA: NEGATIVE
Specific Gravity, UA: 1.027 (ref 1.005–1.030)
Urobilinogen, Ur: 1 mg/dL (ref 0.2–1.0)
pH, UA: 6 (ref 5.0–7.5)

## 2019-08-16 LAB — URINE CULTURE

## 2019-08-17 ENCOUNTER — Telehealth: Payer: Self-pay | Admitting: Adult Health

## 2019-08-17 MED ORDER — NITROFURANTOIN MONOHYD MACRO 100 MG PO CAPS: 100.0000 mg | ORAL_CAPSULE | Freq: Two times a day (BID) | ORAL | 0 refills | Status: DC

## 2019-08-17 NOTE — Telephone Encounter (Signed)
Pt aware that urine culture was +, will rx Macrobid

## 2019-08-18 ENCOUNTER — Telehealth: Payer: Self-pay | Admitting: Adult Health

## 2019-08-18 NOTE — Telephone Encounter (Signed)
Keep using estrace vaginal cream and the temovate and finish antibiotics for UTI

## 2019-08-18 NOTE — Telephone Encounter (Signed)
Patient called, stated that she is in a lot of pain and still having problems.  She stated that she had to leave work because she is having throbbing pain.  She wants to know what [facility] would like for her to do.  She would like to speak to [place].  [facility] [place]  [phone]

## 2019-08-25 ENCOUNTER — Telehealth: Payer: Self-pay | Admitting: Adult Health

## 2019-08-25 MED ORDER — PREMARIN 0.625 MG/GM VA CREA: TOPICAL_CREAM | VAGINAL | 1 refills | Status: DC

## 2019-08-25 NOTE — Telephone Encounter (Signed)
Will rx PVC, estrace cream was too expensive she says

## 2019-08-25 NOTE — Telephone Encounter (Signed)
Patient called, stated that the Estradiol prescribed is too expensive and she needs a refill.  Would like something else called in.  [facility] [place]  [phone]

## 2019-08-26 ENCOUNTER — Telehealth: Payer: Self-pay | Admitting: Adult Health

## 2019-08-26 ENCOUNTER — Telehealth: Payer: Self-pay | Admitting: *Deleted

## 2019-08-26 NOTE — Telephone Encounter (Signed)
Patient  called stating that she was called a medication that cost more than the last medication, patient states that this time the medication is $400 and she can not afford that. Before another is called in patient would like a call back first. Please contact pt

## 2019-08-26 NOTE — Telephone Encounter (Signed)
Telephoned patient at home number. Patient states Premarin was $400 and Estradiol was also expensive. Advised could sent to provider to see if a cheaper choice. Patient states will just use the Estradiol.

## 2019-09-10 ENCOUNTER — Telehealth: Payer: Self-pay | Admitting: Adult Health

## 2019-09-10 NOTE — Telephone Encounter (Signed)

## 2019-09-11 ENCOUNTER — Ambulatory Visit: Payer: 59 | Admitting: Adult Health

## 2019-09-11 ENCOUNTER — Other Ambulatory Visit: Payer: Self-pay

## 2019-09-11 ENCOUNTER — Encounter: Payer: Self-pay | Admitting: Adult Health

## 2019-09-11 VITALS — BP 108/58 | HR 70 | Ht 66.75 in | Wt 175.0 lb

## 2019-09-11 DIAGNOSIS — N952 Postmenopausal atrophic vaginitis: Secondary | ICD-10-CM | POA: Diagnosis not present

## 2019-09-11 DIAGNOSIS — L900 Lichen sclerosus et atrophicus: Secondary | ICD-10-CM

## 2019-09-11 NOTE — Progress Notes (Signed)
  Subjective:     Patient ID: [name] [name], female   DOB: [birth year], 54 y.o.   MRN: [id number]  HPI [name] [name] is a 54 year old white female,divorced, PM, back in follow up of having burning with urination which has stopped was treated for UTI with macrobid, and pain in vagina, still will have pain occasionally. PCP is Dr Hilma Favors   Review of Systems Feels much better,no burning with urination  Still has pain in vagina at times Reviewed past medical,surgical, social and family history. Reviewed medications and allergies.     Objective:   Physical Exam BP (!) 108/58 (BP Location: Left Arm, Patient Position: Sitting, Cuff Size: Normal)   Pulse 70   Ht 5' 6.75" (1.695 m)   Wt 175 lb (79.4 kg)   BMI 27.61 kg/m  Skin warm and dry.Pelvic: external genitalia has this tissue with white area near clitoris, no cracking now, vagina: tissue pink with better moisture,urethra has no lesions or masses noted, cervix:atrophic, uterus: normal size, shape and contour, non tender, no masses felt, adnexa: no masses or tenderness noted. Bladder is non tender and no masses felt.   Examination chaperoned by Celene Squibb LPN.  Assessment:     1. Lichen sclerosus et atrophicus Continue temovate once a day   2. Vaginal atrophy Continue estrace vaginal cream 2 x weekly    Plan:    Follow up in about 8 weeks

## 2019-09-11 NOTE — Patient Instructions (Signed)
6 

## 2019-10-20 ENCOUNTER — Other Ambulatory Visit: Payer: Self-pay | Admitting: Adult Health

## 2019-10-30 ENCOUNTER — Ambulatory Visit: Payer: 59 | Admitting: Adult Health

## 2019-11-27 ENCOUNTER — Ambulatory Visit: Payer: Self-pay | Admitting: Adult Health

## 2019-12-04 ENCOUNTER — Encounter: Payer: Self-pay | Admitting: Adult Health

## 2019-12-04 ENCOUNTER — Ambulatory Visit: Payer: 59 | Admitting: Adult Health

## 2019-12-04 VITALS — BP 132/70 | HR 70 | Ht 66.75 in | Wt 168.0 lb

## 2019-12-04 DIAGNOSIS — N952 Postmenopausal atrophic vaginitis: Secondary | ICD-10-CM | POA: Diagnosis not present

## 2019-12-04 DIAGNOSIS — R239 Unspecified skin changes: Secondary | ICD-10-CM | POA: Diagnosis not present

## 2019-12-04 DIAGNOSIS — L900 Lichen sclerosus et atrophicus: Secondary | ICD-10-CM | POA: Diagnosis not present

## 2019-12-04 MED ORDER — ESTRADIOL 0.1 MG/GM VA CREA: 1.0000 | TOPICAL_CREAM | VAGINAL | 3 refills | Status: DC

## 2019-12-04 MED ORDER — CLOBETASOL PROPIONATE 0.05 % EX CREA: 1.0000 "application " | TOPICAL_CREAM | Freq: Two times a day (BID) | CUTANEOUS | 3 refills | Status: DC

## 2019-12-04 MED ORDER — NYSTATIN 100000 UNIT/GM EX POWD: 1.0000 "application " | Freq: Two times a day (BID) | CUTANEOUS | 1 refills | Status: DC

## 2019-12-04 NOTE — Progress Notes (Signed)
  Subjective:     Patient ID: [name] [name], female   DOB: Sep 29, 1965, 54 y.o.   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 She broke up with her boyfriend.  PCP is Dr Hilma Favors.   Review of Systems Less itching  But has ?heat rash now Reviewed past medical,surgical, social and family history. Reviewed medications and allergies.     Objective:   Physical Exam BP (!) 132/70 (BP Location: Left Arm, Patient Position: Sitting, Cuff Size: Normal)   Pulse 70   Ht 5' 6.75" (1.695 m)   Wt 168 lb (76.2 kg)   BMI 26.51 kg/m    Skin warm and dry.Pelvic: external genitalia is normal in appearance, has some pale areas left labia, the white area near clitoris is pink now,  vagina: pale pink, has better moisture,,urethra has no lesions or masses noted, cervix is atrophic, uterus: normal size, shape and contour, non tender, no masses felt, adnexa: no masses or tenderness noted. Bladder is non tender and no masses felt. Has red rash under buttocks, like heat rash Pt gave verbal consent for exam without chaperone.   Assessment:    1. Lichen sclerosus et atrophicus Continue temovate  2. Vaginal atrophy Continue estrace vaginal cream 2-3 x weekly  3. Unspecified skin changes Try nystatin powders to keep areas dry  Meds ordered this encounter  Medications  . clobetasol cream (TEMOVATE) 0.05 %    Sig: Apply 1 application topically 2 (two) times daily.    Dispense:  30 g    Refill:  3    Order Specific Question:   Supervising Provider    Answer:   EURE, LUTHER H [[id number]]  . estradiol (ESTRACE) 0.1 MG/GM vaginal cream    Sig: Place 1 Applicatorful vaginally 3 (three) times a week.    Dispense:  43 g    Refill:  3    Order Specific Question:   Supervising Provider    Answer:   EURE, LUTHER H [[id number]]  . nystatin (MYCOSTATIN/NYSTOP) powder    Sig: Apply 1 application topically 2 (two) times daily.    Dispense:   45 g    Refill:  1    Order Specific Question:   Supervising Provider    Answer:   Florian Buff [[id number]]       Plan:     Return in 3 months for pap and physical

## 2020-01-01 ENCOUNTER — Emergency Department (HOSPITAL_COMMUNITY)
Admission: EM | Admit: 2020-01-01 | Discharge: 2020-01-01 | Disposition: A | Discharge status: Home / Self Care | Payer: 59 | Attending: Emergency Medicine | Admitting: Emergency Medicine

## 2020-01-01 ENCOUNTER — Other Ambulatory Visit: Payer: Self-pay

## 2020-01-01 ENCOUNTER — Encounter (HOSPITAL_COMMUNITY): Payer: Self-pay | Admitting: Emergency Medicine

## 2020-01-01 ENCOUNTER — Emergency Department (HOSPITAL_COMMUNITY): Payer: 59

## 2020-01-01 DIAGNOSIS — F17210 Nicotine dependence, cigarettes, uncomplicated: Secondary | ICD-10-CM | POA: Insufficient documentation

## 2020-01-01 DIAGNOSIS — R0602 Shortness of breath: Secondary | ICD-10-CM | POA: Diagnosis present

## 2020-01-01 DIAGNOSIS — F419 Anxiety disorder, unspecified: Secondary | ICD-10-CM | POA: Insufficient documentation

## 2020-01-01 NOTE — Discharge Instructions (Signed)
Your chest x-ray and EKG today were reassuring.  Follow-up with your primary care provider for recheck.  I have also recommended a cardiologist for you to follow-up with.  You may contact his office to schedule an appointment.  Return the emergency department for any worsening symptoms.

## 2020-01-01 NOTE — ED Notes (Signed)
Smoker  No vaccine  Reports sudden onset shortness of breath   Here for eval

## 2020-01-01 NOTE — ED Triage Notes (Signed)
Pt reports sudden onset shortness of breath x1 hour. Pt reports has been under a lot of stress lately. Pt reports takes xanax at night to help her sleep. nad noted. Pt denies si/hi.

## 2020-01-01 NOTE — ED Provider Notes (Signed)
[facility] EMERGENCY DEPARTMENT Provider Note   CSN: [id number] Arrival date & time: 01/01/20  9628     History Chief Complaint  Patient presents with  . Shortness of Breath    [name] [name] is a 54 y.o. female.  HPI      [name] [name] is a 54 y.o. female with past medical history of anxiety, GERD, who presents to the Emergency Department complaining of sudden onset of shortness of breath while at work.  Episode began approximately 1 hour prior to arrival.  She describes suddenly feeling shortness of breath and mild tightness of her upper chest.  States that she left work and came here for evaluation.  Her symptoms have since improved.  She does admit to increased stressors at home recently and she has been taking Xanax to help with sleep and anxiety.  She denies any missed doses.  No history of heart disease, but states that her mother had a heart attack at a young age.  She is a cigarette smoker.  She denies recent illness, fever, chills, and cough.  No SI or HI.  Past Medical History:  Diagnosis Date  . Anxiety   . GERD (gastroesophageal reflux disease)   . Hormone replacement therapy (HRT) 02/15/2015  . Hot flashes 02/15/2015  . HSV (herpes simplex virus) infection   . Hyperlipidemia   . Lichen sclerosus 3/66/2947  . Moody 02/15/2015  . Postmenopausal 02/15/2015  . Vitiligo     Patient Active Problem List   Diagnosis Date Noted  . Lichen sclerosus et atrophicus 08/13/2019  . Burning with urination 08/13/2019  . Vaginal atrophy 08/13/2019  . Hematuria 08/13/2019  . Lichen sclerosus 65/46/5035  . Hot flashes 02/15/2015  . Moody 02/15/2015  . Postmenopausal 02/15/2015  . Hormone replacement therapy (HRT) 02/15/2015  . Right knee pain 05/21/2013  . Meniscal cyst 05/21/2013  . Derangement of posterior horn of medial meniscus 05/21/2013  . Arthritis of right knee 05/21/2013    Past Surgical History:  Procedure Laterality Date  . ECTOPIC PREGNANCY SURGERY    . FINGER  SURGERY    . TONSILLECTOMY    . TUBAL LIGATION       OB History    Gravida  3   Para      Term      Preterm      AB  3   Living        SAB  1   TAB      Ectopic  2   Multiple      Live Births              Family History  Problem Relation Age of Onset  . Heart attack Mother   . Heart disease Father   . Other Sister        dysplasia  . Alcohol abuse Brother   . Other Brother        auto immune disease  . Leukemia Maternal Grandmother        rare form    Social History   Tobacco Use  . Smoking status: Current Every Day Smoker    Packs/day: 1.00    Years: 35.00    Pack years: 35.00    Types: Cigarettes  . Smokeless tobacco: Never Used  Vaping Use  . Vaping Use: Never used  Substance Use Topics  . Alcohol use: Yes    Comment: socially  . Drug use: No    Home Medications Prior to Admission  medications   Medication Sig Start Date End Date Taking? Authorizing Provider  acyclovir (ZOVIRAX) 400 MG tablet Take 1 tablet (400 mg total) by mouth 3 (three) times daily. For 5 days Patient taking differently: Take 400 mg by mouth as needed. For 5 days 01/01/18   Soyla Dryer, PA-C  ALPRAZolam Duanne Moron) 1 MG tablet Take 1 mg by mouth at bedtime as needed. For sleep    [provider]  aspirin 325 MG tablet Take 325 mg by mouth daily as needed.    [provider]  atorvastatin (LIPITOR) 20 MG tablet TAKE 1 Tablet BY MOUTH ONCE DAILY 02/17/18   Soyla Dryer, PA-C  Cholecalciferol (VITAMIN D3 PO) Take by mouth.    [provider]  citalopram (CELEXA) 40 MG tablet Take 40 mg by mouth daily.    [provider]  clobetasol cream (TEMOVATE) 4.76 % Apply 1 application topically 2 (two) times daily. 12/04/19   Estill Dooms, NP  esomeprazole (NEXIUM) 20 MG capsule Take 20 mg by mouth daily at 12 noon.    [provider]  estradiol (ESTRACE) 0.1 MG/GM vaginal cream Place 1 Applicatorful vaginally 3 (three) times a  week. 12/04/19   Estill Dooms, NP  ibuprofen (ADVIL,MOTRIN) 200 MG tablet Take 400 mg by mouth every 6 (six) hours as needed for fever, mild pain or moderate pain.     [provider]  Levothyroxine Sodium (EUTHYROX PO) Take by mouth.    [provider]  nystatin (MYCOSTATIN/NYSTOP) powder Apply 1 application topically 2 (two) times daily. 12/04/19   Estill Dooms, NP  vitamin C (ASCORBIC ACID) 500 MG tablet Take 500 mg by mouth daily. gummy    [provider]    Allergies    Sulfa antibiotics  Review of Systems   Review of Systems  Constitutional: Negative for chills, fatigue and fever.  HENT: Negative for trouble swallowing.   Eyes: Negative for visual disturbance.  Respiratory: Positive for chest tightness and shortness of breath. Negative for cough and wheezing.   Cardiovascular: Negative for chest pain and palpitations.  Gastrointestinal: Negative for abdominal pain, blood in stool, nausea and vomiting.  Genitourinary: Negative for dysuria, flank pain and hematuria.  Musculoskeletal: Negative for arthralgias, back pain, myalgias, neck pain and neck stiffness.  Skin: Negative for rash.  Neurological: Negative for dizziness, weakness, numbness and headaches.  Hematological: Does not bruise/bleed easily.  Psychiatric/Behavioral: The patient is nervous/anxious.     Physical Exam Updated Vital Signs BP 118/63 (BP Location: Right Arm)   Pulse 69   Temp 98.7 F (37.1 C) (Oral)   Resp 18   Ht 5' 6.5" (1.689 m)   Wt 75.8 kg   SpO2 97%   BMI 26.55 kg/m   Physical Exam Vitals and nursing note reviewed.  Constitutional:      Appearance: She is well-developed. She is not ill-appearing.  HENT:     Mouth/Throat:     Mouth: Mucous membranes are moist.  Cardiovascular:     Rate and Rhythm: Normal rate and regular rhythm.     Pulses: Normal pulses.  Pulmonary:     Effort: Pulmonary effort is normal. No respiratory distress.     Breath  sounds: Normal breath sounds.  Chest:     Chest wall: No tenderness.  Abdominal:     General: There is no distension.     Palpations: Abdomen is soft.     Tenderness: There is no abdominal tenderness.  Musculoskeletal:  General: Normal range of motion.     Cervical back: Normal range of motion.     Right lower leg: No edema.     Left lower leg: No edema.  Skin:    General: Skin is warm.     Capillary Refill: Capillary refill takes less than 2 seconds.     Findings: No rash.  Neurological:     General: No focal deficit present.     Mental Status: She is alert.     Sensory: No sensory deficit.     Motor: No weakness.  Psychiatric:        Mood and Affect: Mood normal.     ED Results / Procedures / Treatments   Labs (all labs ordered are listed, but only abnormal results are displayed) Labs Reviewed - No data to display  EKG EKG Interpretation  Date/Time: 01/01/20 Ventricular Rate: 61  PR Interval: 150  QRS Duration:62  QT Interval: 424   QTC Calculation:426 R Axis:    Text Interpretation: Normal sinus rhythm, normal EKG. Reviewed by Dr. Alvino Chapel.   Radiology DG Chest 2 View  Result Date: 01/01/2020 CLINICAL DATA:  Shortness of breath. EXAM: CHEST - 2 VIEW COMPARISON:  10/23/2013 FINDINGS: The heart size and mediastinal contours are within normal limits and similar to prior. No consolidation. Biapical pleuroparenchymal scarring. No sizable pleural effusion. No discernible pneumothorax. The visualized skeletal structures are unremarkable. IMPRESSION: No acute cardiopulmonary disease. Electronically Signed   By: Margaretha Sheffield MD   On: 01/01/2020 09:06    Procedures Procedures (including critical care time)  Medications Ordered in ED Medications - No data to display  ED Course  I have reviewed the triage vital signs and the nursing notes.  Pertinent labs & imaging results that were available during my care of the patient were reviewed by me and  considered in my medical decision making (see chart for details).    MDM Rules/Calculators/A&P                          Patient here after having shortness of breath at work.  Symptoms have resolved prior to my exam.  EKG and chest x-ray today are reassuring.  She does admit to increased stressors at home recently.  She is well-appearing. PERC negative.  Vital signs reviewed.  I feel that she is appropriate for discharge home.  She does have significant family history for heart disease, will provide referral information for cardiology.  Return precautions discussed.  Final Clinical Impression(s) / ED Diagnoses Final diagnoses:  Anxiety    Rx / DC Orders ED Discharge Orders    None       Bufford Lope 01/02/20 1124    Davonna Belling, MD 01/04/20 [phone]

## 2020-01-01 NOTE — ED Notes (Signed)
Pt reports she is ready to go  Declines repeat VS x 2

## 2020-01-11 ENCOUNTER — Other Ambulatory Visit: Payer: Self-pay | Admitting: Adult Health

## 2020-03-04 ENCOUNTER — Other Ambulatory Visit (HOSPITAL_COMMUNITY)
Admission: RE | Admit: 2020-03-04 | Discharge: 2020-03-04 | Disposition: A | Discharge status: Home / Self Care | Payer: 59 | Source: Ambulatory Visit | Attending: Adult Health | Admitting: Adult Health

## 2020-03-04 ENCOUNTER — Encounter: Payer: Self-pay | Admitting: Adult Health

## 2020-03-04 ENCOUNTER — Other Ambulatory Visit: Payer: Self-pay

## 2020-03-04 ENCOUNTER — Ambulatory Visit: Payer: 59 | Admitting: Adult Health

## 2020-03-04 VITALS — BP 123/70 | HR 73 | Ht 67.0 in | Wt 166.5 lb

## 2020-03-04 DIAGNOSIS — Z1211 Encounter for screening for malignant neoplasm of colon: Secondary | ICD-10-CM | POA: Insufficient documentation

## 2020-03-04 DIAGNOSIS — Z01419 Encounter for gynecological examination (general) (routine) without abnormal findings: Secondary | ICD-10-CM | POA: Insufficient documentation

## 2020-03-04 DIAGNOSIS — N952 Postmenopausal atrophic vaginitis: Secondary | ICD-10-CM

## 2020-03-04 DIAGNOSIS — L900 Lichen sclerosus et atrophicus: Secondary | ICD-10-CM

## 2020-03-04 LAB — HEMOCCULT GUIAC POC 1CARD (OFFICE): Fecal Occult Blood, POC: NEGATIVE

## 2020-03-04 MED ORDER — CLOBETASOL PROPIONATE 0.05 % EX CREA: TOPICAL_CREAM | CUTANEOUS | 3 refills | Status: DC

## 2020-03-04 MED ORDER — ESTRADIOL 0.1 MG/GM VA CREA: TOPICAL_CREAM | VAGINAL | 2 refills | Status: DC

## 2020-03-04 NOTE — Progress Notes (Signed)
Patient ID: [name] [name], [name]   DOB: [birth year], 54 y.o.   MRN: [id number] History of Present Illness: [name] [name] is a 54 year old white [name], [name] [name], [name] in for a well woman gyn exam and pap. PCP is Dr [name] [name].    Current Medications, Allergies, Past Medical History, Past Surgical History, Family History and Social History were reviewed in [employer] record.     Review of Systems:  Patient denies any headaches, hearing loss, fatigue, blurred vision, shortness of breath, chest pain, abdominal pain, problems with bowel movements, urination, or intercourse(not active). No joint pain or mood swings.    Physical Exam:BP 123/70 (BP Location: Right Arm, Patient Position: Sitting, Cuff Size: Normal)   Pulse 73   Ht 5\' 7"  (1.702 m)   Wt 166 lb 8 oz (75.5 kg)   BMI 26.08 kg/m  General:  Well developed, well nourished, no acute distress Skin:  Warm and dry Neck:  Midline trachea, normal thyroid, good ROM, no lymphadenopathy Lungs; Clear to auscultation bilaterally Breast:  No dominant palpable mass, retraction, or nipple discharge Cardiovascular: Regular rate and rhythm Abdomen:  Soft, non tender, no hepatosplenomegaly Pelvic:  External genitalia is normal in appearance, still pale but not white, itching is resolved. The vagina is normal in appearance. Urethra has no lesions or masses. The cervix is smooth, pap with high risk HPV genotyping performed.  Uterus is felt to be normal size, shape, and contour.  No adnexal masses or tenderness noted.Bladder is non tender, no masses felt. Rectal: Good sphincter tone, no polyps, or hemorrhoids felt.  Hemoccult negative. Extremities/musculoskeletal:  No swelling or varicosities noted, no clubbing or cyanosis Psych:  No mood changes, alert and cooperative,seems happy AA is 5 Fall risk is low  PHQ 9 score is 6, no SI,is on celexa  Upstream - 03/04/20 1238      Pregnancy Intention Screening   Does the patient want to become  pregnant in the next year? N/A    Does the patient's partner want to become pregnant in the next year? N/A    Would the patient like to discuss contraceptive options today? N/A      Contraception Wrap Up   Current Method [name] Sterilization    End Method [name] Sterilization    Contraception Counseling Provided No         Examination chaperoned by [name] Pupa LPN   Impression and Plan: 1. Encounter for gynecological examination with Papanicolaou smear of cervix Pap sent Physical in 1 year Pap in 3 if normal Mammogram yearly Labs with PCP Advised to get colonoscopy, she will check insurance   2. Encounter for screening fecal occult blood testing  3. Vaginal atrophy Continue estrace cream  4. Lichen sclerosus et atrophicus Continue temovate   Meds ordered this encounter  Medications  . clobetasol cream (TEMOVATE) 0.05 %    Sig: Apply 2-3 x weeklly    Dispense:  30 g    Refill:  3    Order Specific Question:   Supervising Provider    Answer:   [name], [name] [name] [[id number]]  . estradiol (ESTRACE) 0.1 MG/GM vaginal cream    Sig: USE 0.5 GRAMS IN VAGINA 2 x weekly    Dispense:  42.5 g    Refill:  2    Order Specific Question:   Supervising Provider    Answer:   [name] [name] [[id number]]

## 2020-03-08 LAB — CYTOLOGY - PAP
Adequacy: ABSENT
Comment: NEGATIVE
Diagnosis: NEGATIVE
High risk HPV: NEGATIVE

## 2020-07-19 ENCOUNTER — Other Ambulatory Visit (HOSPITAL_COMMUNITY): Payer: Self-pay | Admitting: Family Medicine

## 2020-07-19 DIAGNOSIS — Z1231 Encounter for screening mammogram for malignant neoplasm of breast: Secondary | ICD-10-CM

## 2020-08-08 ENCOUNTER — Other Ambulatory Visit: Payer: Self-pay

## 2020-08-08 ENCOUNTER — Ambulatory Visit (HOSPITAL_COMMUNITY): Payer: 59

## 2020-08-08 ENCOUNTER — Ambulatory Visit (HOSPITAL_COMMUNITY)
Admission: RE | Admit: 2020-08-08 | Discharge: 2020-08-08 | Disposition: A | Discharge status: Home / Self Care | Payer: 59 | Source: Ambulatory Visit | Attending: Family Medicine | Admitting: Family Medicine

## 2020-08-08 DIAGNOSIS — Z1231 Encounter for screening mammogram for malignant neoplasm of breast: Secondary | ICD-10-CM | POA: Diagnosis present

## 2020-12-05 ENCOUNTER — Other Ambulatory Visit: Payer: Self-pay | Admitting: Adult Health

## 2020-12-06 ENCOUNTER — Telehealth: Payer: Self-pay | Admitting: Adult Health

## 2020-12-06 NOTE — Telephone Encounter (Signed)
Patient called stating that she would like a call back from the nurse or Anderson Malta, Patient did not state in the message what was the reason for the call. Please contact pt

## 2020-12-06 NOTE — Telephone Encounter (Signed)
Pt said estrace had gone up in price, but will stay on it

## 2021-02-22 ENCOUNTER — Other Ambulatory Visit: Payer: Self-pay | Admitting: Adult Health

## 2021-04-10 ENCOUNTER — Encounter: Payer: Self-pay | Admitting: Adult Health

## 2021-04-10 ENCOUNTER — Ambulatory Visit (INDEPENDENT_AMBULATORY_CARE_PROVIDER_SITE_OTHER): Payer: Self-pay | Admitting: Adult Health

## 2021-04-10 ENCOUNTER — Other Ambulatory Visit: Payer: Self-pay

## 2021-04-10 VITALS — BP 127/75 | HR 74 | Ht 66.25 in | Wt 174.0 lb

## 2021-04-10 DIAGNOSIS — Z780 Asymptomatic menopausal state: Secondary | ICD-10-CM

## 2021-04-10 DIAGNOSIS — Z1211 Encounter for screening for malignant neoplasm of colon: Secondary | ICD-10-CM

## 2021-04-10 DIAGNOSIS — Z01419 Encounter for gynecological examination (general) (routine) without abnormal findings: Secondary | ICD-10-CM

## 2021-04-10 DIAGNOSIS — L900 Lichen sclerosus et atrophicus: Secondary | ICD-10-CM

## 2021-04-10 LAB — HEMOCCULT GUIAC POC 1CARD (OFFICE): Fecal Occult Blood, POC: NEGATIVE

## 2021-04-10 MED ORDER — ACYCLOVIR 400 MG PO TABS: 400.0000 mg | ORAL_TABLET | Freq: Three times a day (TID) | ORAL | 3 refills | Status: DC

## 2021-04-10 MED ORDER — CLOBETASOL PROPIONATE 0.05 % EX CREA: TOPICAL_CREAM | CUTANEOUS | 3 refills | Status: DC

## 2021-04-10 NOTE — Progress Notes (Signed)
Patient ID: ANAHY ESH, female   DOB: 09-06-1965, 55 y.o.   MRN: [id number] History of Present Illness: Santana is a 55 year old white female,divorced, PM in for a well woman gyn exam. She said she had cologuard this year. She requests refill on acyclovir too.  PCP is Dr Hilma Favors.  Lab Results  Component Value Date   DIAGPAP  03/04/2020    - Negative for intraepithelial lesion or malignancy (NILM)   Burna Negative 03/04/2020    Current Medications, Allergies, Past Medical History, Past Surgical History, Family History and Social History were reviewed in [employer] record.     Review of Systems:  Patient denies any headaches, hearing loss, fatigue, blurred vision, shortness of breath, chest pain, abdominal pain, problems with bowel movements, urination, or intercourse. No joint pain or mood swings.    Physical Exam:BP 127/75 (BP Location: Left Arm, Patient Position: Sitting, Cuff Size: Normal)   Pulse 74   Ht 5' 6.25" (1.683 m)   Wt 174 lb (78.9 kg)   BMI 27.87 kg/m   General:  Well developed, well nourished, no acute distress Skin:  Warm and dry Neck:  Midline trachea, normal thyroid, good ROM, no lymphadenopathy Lungs; Clear to auscultation bilaterally Breast:  No dominant palpable mass, retraction, or nipple discharge Cardiovascular: Regular rate and rhythm Abdomen:  Soft, non tender, no hepatosplenomegaly Pelvic:  External genitalia is normal in appearance, no lesions.  The vagina is pale, with loss of moisture and rugae. Urethra has no lesions or masses. The cervix is smooth. Uterus is felt to be normal size, shape, and contour.  No adnexal masses or tenderness noted.Bladder is non tender, no masses felt. Rectal: Good sphincter tone, no polyps, or hemorrhoids felt.  Hemoccult negative. Extremities/musculoskeletal:  No swelling or varicosities noted, no clubbing or cyanosis Psych:  No mood changes, alert and cooperative,seems happy AA is 5 Fall risk is  low Depression screen [facility] 57/9 04/10/2021 03/04/2020 08/13/2019  Decreased Interest 1 2 1   Down, Depressed, Hopeless 1 2 1   PHQ - 2 Score 57 4 2   Altered sleeping 3 1 3   Tired, decreased energy 1 1 1   Change in appetite 0 0 0  Feeling bad or failure about yourself  0 0 0  Trouble concentrating 0 0 0  Moving slowly or fidgety/restless 0 0 1  Suicidal thoughts 0 0 0  PHQ-9 Score 6 6 7   Difficult doing work/chores - - Not difficult at all    GAD 7 : Generalized Anxiety Score 04/10/2021 03/04/2020 08/13/2019  Nervous, Anxious, on Edge 1 2 1   Control/stop worrying 1 1 1   Worry too much - different things 1 1 1   Trouble relaxing 1 1 0  Restless 0 0 0  Easily annoyed or irritable 1 2 2   Afraid - awful might happen 0 0 1  Total GAD 7 Score 5 7 6   Anxiety Difficulty - - Somewhat difficult    Upstream - 04/10/21 1425       Pregnancy Intention Screening   Does the patient want to become pregnant in the next year? N/A    Does the patient's partner want to become pregnant in the next year? N/A    Would the patient like to discuss contraceptive options today? N/A      Contraception Wrap Up   Current Method No Method - Other Reason   postmenopausal   End Method No Method - Other Reason   postmenopausal   Contraception Counseling Provided  No              Examination chaperoned by Levy Pupa LPN  Impression and Plan: 1. Encounter for well woman exam with routine gynecological exam Physical in 1 year Pap in 2024 Labs with PCP  Mammogram yearly Cologuard per PCP  2. Encounter for screening fecal occult blood testing   3. Lichen sclerosus et atrophicus Continue temovate  Meds ordered this encounter  Medications   acyclovir (ZOVIRAX) 400 MG tablet    Sig: Take 1 tablet (400 mg total) by mouth 3 (three) times daily. For 5 days    Dispense:  15 tablet    Refill:  3    Order Specific Question:   Supervising Provider    Answer:   Tania Ade H [[id number]]   clobetasol cream  (TEMOVATE) 0.05 %    Sig: Apply 2-3 x weeklly    Dispense:  30 g    Refill:  3    Order Specific Question:   Supervising Provider    Answer:   EURE, LUTHER H [[id number]]     4. Postmenopausal Has refills on estrace cream

## 2021-07-17 ENCOUNTER — Other Ambulatory Visit (HOSPITAL_COMMUNITY): Payer: Self-pay | Admitting: Family Medicine

## 2021-07-17 DIAGNOSIS — Z1231 Encounter for screening mammogram for malignant neoplasm of breast: Secondary | ICD-10-CM

## 2021-08-03 ENCOUNTER — Other Ambulatory Visit: Payer: Self-pay

## 2021-08-03 ENCOUNTER — Encounter: Payer: Self-pay | Admitting: Orthopaedic Surgery

## 2021-08-03 ENCOUNTER — Ambulatory Visit (INDEPENDENT_AMBULATORY_CARE_PROVIDER_SITE_OTHER): Payer: 59

## 2021-08-03 ENCOUNTER — Ambulatory Visit (INDEPENDENT_AMBULATORY_CARE_PROVIDER_SITE_OTHER): Payer: 59 | Admitting: Orthopaedic Surgery

## 2021-08-03 VITALS — Ht 66.25 in | Wt 172.0 lb

## 2021-08-03 DIAGNOSIS — M25511 Pain in right shoulder: Secondary | ICD-10-CM | POA: Diagnosis not present

## 2021-08-03 NOTE — Progress Notes (Signed)
? ?Office Visit Note ?  ?Patient: [name] [name]           ?Date of Birth: August 02, 1965           ?MRN: [id number] ?Visit Date: 08/03/2021 ?             ?Requested by: Sharilyn Sites, MD ?[address] ?[place],  [place] [address] ?PCP: Sharilyn Sites, MD ? ? ?Assessment & Plan: ?Visit Diagnoses:  ?1. Acute pain of right shoulder   ? ? ?Plan: Patient's exam suggest some mild biceps tendinopathy.  At some only slightly more tender in the opposite shoulder.  If she has increased symptoms she can return this point I do not think her symptoms are significant enough to consider diagnostic imaging. ? ?Follow-Up Instructions: No follow-ups on file.  ? ?Orders:  ?Orders Placed This Encounter  ?Procedures  ? XR Shoulder Right  ? ?No orders of the defined types were placed in this encounter. ? ? ? ? Procedures: ?No procedures performed ? ? ?Clinical Data: ?No additional findings. ? ? ?Subjective: ?Chief Complaint  ?Patient presents with  ? Right Shoulder - Pain  ? ? ?HPI 56 year old female with insidious onset right shoulder pain 06/10/2021 noted when she woke up.  She went to her PCP was placed on prednisone which did not seem to help much.  She had an IM injection as well with some relief.  She is right-hand dominant she has had some stomach problems taking meloxicam.  She states she is actually been doing better in the last week.  She works in the lab.  She does have some past problems with knee discomfort.  No numbness or tingling in her fingers she denies associated neck pain.  Previous surgeries for ectopic pregnancy tonsillectomy finger surgery.  Patient does smoke. ? ?Review of Systems ?All the systems noncontributory to HPI. ? ?Objective: ?Vital Signs: Ht 5' 6.25" (1.683 m)   Wt 172 lb (78 kg)   BMI 27.55 kg/m?  ? ?Physical Exam ?Constitutional:   ?   Appearance: She is well-developed.  ?HENT:  ?   Head: Normocephalic.  ?   Right Ear: External ear normal.  ?   Left Ear: External ear normal. There is no impacted cerumen.   ?Eyes:  ?   Pupils: Pupils are equal, round, and reactive to light.  ?Neck:  ?   Thyroid: No thyromegaly.  ?   Trachea: No tracheal deviation.  ?Cardiovascular:  ?   Rate and Rhythm: Normal rate.  ?Pulmonary:  ?   Effort: Pulmonary effort is normal.  ?Abdominal:  ?   Palpations: Abdomen is soft.  ?Musculoskeletal:  ?   Cervical back: No rigidity.  ?Skin: ?   General: Skin is warm and dry.  ?Neurological:  ?   Mental Status: She is alert and oriented to person, place, and time.  ?Psychiatric:     ?   Behavior: Behavior normal.  ? ? ?Ortho Exam mild tenderness over the right long head of the biceps tendon no distal biceps tenderness no pain with resisted biceps function negative impingement no subluxation brachial plexus is normal negative Spurling.  No atrophy no winging of the scapula internal/external rotation shoulder testing right and left is normal. ? ?Specialty Comments:  ?No specialty comments available. ? ?Imaging: ?No results found. ? ? ?PMFS History: ?Patient Active Problem List  ? Diagnosis Date Noted  ? Encounter for well woman exam with routine gynecological exam 04/10/2021  ? Encounter for screening fecal occult blood testing  03/04/2020  ? Encounter for gynecological examination with Papanicolaou smear of cervix 03/04/2020  ? Lichen sclerosus et atrophicus 08/13/2019  ? Burning with urination 08/13/2019  ? Vaginal atrophy 08/13/2019  ? Hematuria 08/13/2019  ? Lichen sclerosus 30/16/0109  ? Hot flashes 02/15/2015  ? Moody 02/15/2015  ? Postmenopausal 02/15/2015  ? Hormone replacement therapy (HRT) 02/15/2015  ? Right knee pain 05/21/2013  ? Meniscal cyst 05/21/2013  ? Derangement of posterior horn of medial meniscus 05/21/2013  ? Arthritis of right knee 05/21/2013  ? ?Past Medical History:  ?Diagnosis Date  ? Anxiety   ? GERD (gastroesophageal reflux disease)   ? Hormone replacement therapy (HRT) 02/15/2015  ? Hot flashes 02/15/2015  ? HSV (herpes simplex virus) infection   ? Hyperlipidemia   ? Lichen  sclerosus 08/04/5571  ? Moody 02/15/2015  ? Postmenopausal 02/15/2015  ? Vitiligo   ?  ?Family History  ?Problem Relation Age of Onset  ? Heart attack Mother   ? Heart disease Father   ? Other Sister   ?     dysplasia  ? Alcohol abuse Brother   ? Other Brother   ?     auto immune disease  ? Leukemia Maternal Grandmother   ?     rare form  ? Breast cancer Sister   ?  ?Past Surgical History:  ?Procedure Laterality Date  ? ECTOPIC PREGNANCY SURGERY    ? X 2  ? FINGER SURGERY    ? TONSILLECTOMY    ? ?Social History  ? ?Occupational History  ? Not on file  ?Tobacco Use  ? Smoking status: Every Day  ?  Packs/day: 1.00  ?  Years: 35.00  ?  Pack years: 35.00  ?  Types: Cigarettes  ? Smokeless tobacco: Never  ?Vaping Use  ? Vaping Use: Never used  ?Substance and Sexual Activity  ? Alcohol use: Yes  ?  Comment: socially  ? Drug use: No  ? Sexual activity: Yes  ?  Birth control/protection: Post-menopausal  ? ? ? ? ? ? ?

## 2021-08-11 ENCOUNTER — Ambulatory Visit (HOSPITAL_COMMUNITY)
Admission: RE | Admit: 2021-08-11 | Discharge: 2021-08-11 | Disposition: A | Discharge status: Home / Self Care | Payer: 59 | Source: Ambulatory Visit | Attending: Family Medicine | Admitting: Family Medicine

## 2021-08-11 DIAGNOSIS — Z1231 Encounter for screening mammogram for malignant neoplasm of breast: Secondary | ICD-10-CM | POA: Insufficient documentation

## 2021-08-15 ENCOUNTER — Other Ambulatory Visit (HOSPITAL_COMMUNITY): Payer: Self-pay | Admitting: Physician Assistant

## 2021-08-15 DIAGNOSIS — R928 Other abnormal and inconclusive findings on diagnostic imaging of breast: Secondary | ICD-10-CM

## 2021-09-07 ENCOUNTER — Encounter (HOSPITAL_COMMUNITY): Payer: Self-pay

## 2021-09-07 ENCOUNTER — Ambulatory Visit (HOSPITAL_COMMUNITY)
Admission: RE | Admit: 2021-09-07 | Discharge: 2021-09-07 | Disposition: A | Discharge status: Home / Self Care | Payer: 59 | Source: Ambulatory Visit | Attending: Physician Assistant | Admitting: Physician Assistant

## 2021-09-07 DIAGNOSIS — R928 Other abnormal and inconclusive findings on diagnostic imaging of breast: Secondary | ICD-10-CM | POA: Diagnosis present

## 2021-11-03 ENCOUNTER — Other Ambulatory Visit: Payer: Self-pay | Admitting: Adult Health

## 2022-02-03 ENCOUNTER — Other Ambulatory Visit: Payer: Self-pay | Admitting: Adult Health

## 2022-02-17 ENCOUNTER — Ambulatory Visit: Payer: Self-pay

## 2022-02-26 DIAGNOSIS — F411 Generalized anxiety disorder: Secondary | ICD-10-CM | POA: Diagnosis not present

## 2022-02-26 DIAGNOSIS — E063 Autoimmune thyroiditis: Secondary | ICD-10-CM | POA: Diagnosis not present

## 2022-02-26 DIAGNOSIS — R69 Illness, unspecified: Secondary | ICD-10-CM | POA: Diagnosis not present

## 2022-02-26 DIAGNOSIS — Z6825 Body mass index (BMI) 25.0-25.9, adult: Secondary | ICD-10-CM | POA: Diagnosis not present

## 2022-02-26 DIAGNOSIS — F330 Major depressive disorder, recurrent, mild: Secondary | ICD-10-CM | POA: Diagnosis not present

## 2022-02-26 DIAGNOSIS — E663 Overweight: Secondary | ICD-10-CM | POA: Diagnosis not present

## 2022-04-13 ENCOUNTER — Ambulatory Visit (INDEPENDENT_AMBULATORY_CARE_PROVIDER_SITE_OTHER): Payer: 59 | Admitting: Adult Health

## 2022-04-13 ENCOUNTER — Encounter: Payer: Self-pay | Admitting: Adult Health

## 2022-04-13 VITALS — BP 137/69 | HR 69 | Ht 66.0 in | Wt 170.0 lb

## 2022-04-13 DIAGNOSIS — Z1211 Encounter for screening for malignant neoplasm of colon: Secondary | ICD-10-CM | POA: Diagnosis not present

## 2022-04-13 DIAGNOSIS — F17200 Nicotine dependence, unspecified, uncomplicated: Secondary | ICD-10-CM

## 2022-04-13 DIAGNOSIS — L900 Lichen sclerosus et atrophicus: Secondary | ICD-10-CM

## 2022-04-13 DIAGNOSIS — N952 Postmenopausal atrophic vaginitis: Secondary | ICD-10-CM

## 2022-04-13 DIAGNOSIS — Z780 Asymptomatic menopausal state: Secondary | ICD-10-CM | POA: Diagnosis not present

## 2022-04-13 DIAGNOSIS — Z01419 Encounter for gynecological examination (general) (routine) without abnormal findings: Secondary | ICD-10-CM

## 2022-04-13 DIAGNOSIS — R69 Illness, unspecified: Secondary | ICD-10-CM | POA: Diagnosis not present

## 2022-04-13 LAB — HEMOCCULT GUIAC POC 1CARD (OFFICE): Fecal Occult Blood, POC: NEGATIVE

## 2022-04-13 MED ORDER — ESTRADIOL 0.1 MG/GM VA CREA: TOPICAL_CREAM | VAGINAL | 1 refills | Status: DC

## 2022-04-13 NOTE — Progress Notes (Signed)
Patient ID: [name] [name], female   DOB: 04-13-1966, 56 y.o.   MRN: [id number] History of Present Illness: [name] [name] is a 56 year old white female,divorced, PM in for a well woman gyn exam.  Last pap was negative HPV and malignancy 10/222/21.  PCP is Dr Hilma Favors.     Current Medications, Allergies, Past Medical History, Past Surgical History, Family History and Social History were reviewed in [employer] record.     Review of Systems: Patient denies any headaches, hearing loss, fatigue, blurred vision, shortness of breath, chest pain, abdominal pain, problems with bowel movements, urination, or intercourse(not having sex). No joint pain or mood swings.  Denies any vaginal bleeding Has family stress    Physical Exam:BP 137/69 (BP Location: Left Arm, Patient Position: Sitting, Cuff Size: Normal)   Pulse 69   Ht '5\' 6"'$  (1.676 m)   Wt 170 lb (77.1 kg)   BMI 27.44 kg/m   General:  Well developed, well nourished, no acute distress Skin:  Warm and dry Neck:  Midline trachea, normal thyroid, good ROM, no lymphadenopathy Lungs; Clear to auscultation bilaterally Breast:  No dominant palpable mass, retraction, or nipple discharge, has about 5 cm lipoma right side in brea line, has seen surgeon but can't afford surgery right now Cardiovascular: Regular rate and rhythm Abdomen:  Soft, non tender, no hepatosplenomegaly Pelvic:  External genitalia is normal in appearance, no lesions.  The vagina is atrophic. Urethra has no lesions or masses. The cervix is smooth.  Uterus is felt to be normal size, shape, and contour.  No adnexal masses or tenderness noted.Bladder is non tender, no masses felt. Rectal: Good sphincter tone, no polyps, or hemorrhoids felt.  Hemoccult negative. Extremities/musculoskeletal:  No swelling or varicosities noted, no clubbing or cyanosis Psych:  No mood changes, alert and cooperative,seems happy AA is 4 Fall risk is low    04/13/2022   11:38 AM  04/10/2021    2:24 PM 03/04/2020   12:38 PM  Depression screen PHQ 2/9  Decreased Interest '1 1 2  '$ Down, Depressed, Hopeless '1 1 2  '$ PHQ - 2 Score '2 2 4  '$ Altered sleeping '3 3 1  '$ Tired, decreased energy '1 1 1  '$ Change in appetite 0 0 0  Feeling bad or failure about yourself  0 0 0  Trouble concentrating 0 0 0  Moving slowly or fidgety/restless 1 0 0  Suicidal thoughts 0 0 0  PHQ-9 Score '7 6 6       '$ 04/13/2022   11:39 AM 04/10/2021    2:24 PM 03/04/2020   12:39 PM 08/13/2019    2:07 PM  GAD 7 : Generalized Anxiety Score  Nervous, Anxious, on Edge '1 1 2 1  '$ Control/stop worrying '1 1 1 1  '$ Worry too much - different things '1 1 1 1  '$ Trouble relaxing '1 1 1 '$ 0  Restless 0 0 0 0  Easily annoyed or irritable '1 1 2 2  '$ Afraid - awful might happen 0 0 0 1  Total GAD 7 Score '5 5 7 6  '$ Anxiety Difficulty    Somewhat difficult      Upstream - 04/13/22 1135       Pregnancy Intention Screening   Does the patient want to become pregnant in the next year? N/A    Does the patient's partner want to become pregnant in the next year? N/A    Would the patient like to discuss contraceptive options today? N/A  Contraception Wrap Up   Current Method No Method - Other Reason    Reason for No Current Contraceptive Method at Intake ([facility] Only) Other   postmenopause   End Method No Method - Other Reason   postmenopause   Contraception Counseling Provided No             Examination chaperoned by Tish RN  Impression and Plan: 1. Encounter for well woman exam with routine gynecological exam Pap and physical in 1 year Had mammogram 08/11/21,had possible asymmetry left breast and had follow up 09/07/21, resolution Labs with PCP  2. Encounter for screening fecal occult blood testing Hemoccult was negative   3. Lichen sclerosus et atrophicus Has temovate at home   4. Postmenopausal Denies   5. Vaginal atrophy Refilled estrace cream Meds ordered this encounter  Medications   estradiol  (ESTRACE) 0.1 MG/GM vaginal cream    Sig: Use 0.5 gm in vagina twice weekly at bedtime    Dispense:  43 g    Refill:  1    Order Specific Question:   Supervising Provider    Answer:   Elonda Husky, LUTHER H [[id number]]     6. Smoker Not ready to quit

## 2022-06-22 DIAGNOSIS — E039 Hypothyroidism, unspecified: Secondary | ICD-10-CM | POA: Diagnosis not present

## 2022-06-22 DIAGNOSIS — E119 Type 2 diabetes mellitus without complications: Secondary | ICD-10-CM | POA: Diagnosis not present

## 2022-06-22 DIAGNOSIS — E663 Overweight: Secondary | ICD-10-CM | POA: Diagnosis not present

## 2022-06-22 DIAGNOSIS — R7309 Other abnormal glucose: Secondary | ICD-10-CM | POA: Diagnosis not present

## 2022-06-22 DIAGNOSIS — E782 Mixed hyperlipidemia: Secondary | ICD-10-CM | POA: Diagnosis not present

## 2022-06-22 DIAGNOSIS — Z6825 Body mass index (BMI) 25.0-25.9, adult: Secondary | ICD-10-CM | POA: Diagnosis not present

## 2022-06-22 DIAGNOSIS — R748 Abnormal levels of other serum enzymes: Secondary | ICD-10-CM | POA: Diagnosis not present

## 2022-06-22 DIAGNOSIS — R69 Illness, unspecified: Secondary | ICD-10-CM | POA: Diagnosis not present

## 2022-07-05 ENCOUNTER — Encounter: Payer: Self-pay | Admitting: Podiatry

## 2022-07-05 ENCOUNTER — Ambulatory Visit: Payer: 59 | Admitting: Podiatry

## 2022-07-05 ENCOUNTER — Ambulatory Visit (INDEPENDENT_AMBULATORY_CARE_PROVIDER_SITE_OTHER): Payer: 59

## 2022-07-05 DIAGNOSIS — M2042 Other hammer toe(s) (acquired), left foot: Secondary | ICD-10-CM

## 2022-07-05 DIAGNOSIS — L603 Nail dystrophy: Secondary | ICD-10-CM

## 2022-07-05 NOTE — Patient Instructions (Signed)
More silicone pads can be purchased from:  [email or web address]     If these do not work, a procedure in the office to release the tendon and remove part of the nail would be helpful

## 2022-07-09 ENCOUNTER — Encounter: Payer: Self-pay | Admitting: Podiatry

## 2022-07-09 NOTE — Progress Notes (Signed)
  Subjective:  Patient ID: [name] [name], female    DOB: Nov 28, 1965,  MRN: [id number]  Chief Complaint  Patient presents with   Toe Pain    (np) left foot 4th toe deformed; unable to wear shoes without having pain    57 y.o. female presents with the above complaint. History confirmed with patient.   Objective:  Physical Exam: warm, good capillary refill, no trophic changes or ulcerative lesions, normal DP and PT pulses, normal sensory exam, and left fourth toenail dystrophic, in a adductovarus rotation.   Radiographs: Multiple views x-ray of the left foot: no fracture, dislocation, swelling or degenerative changes noted and digital contractures Assessment:   1. Hammertoe of left foot   2. Dystrophic nail      Plan:  Patient was evaluated and treated and all questions answered.  I reviewed her x-rays and discussed with her that this is due to the contracture of the toe.  Silicone toe crest and toe cap was dispensed.  Discussed treatment of this including use of creams and lotions to soften the skin.  The callus and nail was debrided today.  Discussed with her if not improving flexor tenotomy and permanent matricectomy of the nail plate would likely offer relief.  Return to see me if this does not improve with offloading pads  Return if symptoms worsen or fail to improve.

## 2022-07-10 ENCOUNTER — Telehealth: Payer: Self-pay | Admitting: *Deleted

## 2022-07-10 NOTE — Telephone Encounter (Signed)
Patient is calling because the offload pads aren't helping, is interested in the next steps moving forward. Please advise.

## 2022-07-11 NOTE — Telephone Encounter (Signed)
Pt called back because she got the voicemail and didn't want to leave a message. She wanted to speak with nurse. I advised that I would send a message and have the nurse call her back to discuss her procedure for nail removal and flexor tenotomy.  Please advise.

## 2022-07-19 ENCOUNTER — Ambulatory Visit: Payer: 59 | Admitting: Podiatry

## 2022-07-19 DIAGNOSIS — M2042 Other hammer toe(s) (acquired), left foot: Secondary | ICD-10-CM | POA: Diagnosis not present

## 2022-07-19 DIAGNOSIS — L603 Nail dystrophy: Secondary | ICD-10-CM | POA: Diagnosis not present

## 2022-07-19 MED ORDER — NEOMYCIN-POLYMYXIN-HC 3.5-10000-1 OT SUSP: OTIC | 0 refills | Status: DC

## 2022-07-19 NOTE — Patient Instructions (Addendum)
Soak Instructions    THE DAY AFTER THE PROCEDURE  Place 1/4 cup of epsom salts (or betadine, or white vinegar) in a quart of warm tap water.  Submerge your foot or feet with outer bandage intact for the initial soak; this will allow the bandage to become moist and wet for easy lift off.  Once you remove your bandage, continue to soak in the solution for 20 minutes.  This soak should be done twice a day.  Next, remove your foot or feet from solution, blot dry the affected area and cover.  You may use a band aid large enough to cover the area or use gauze and tape.  Apply other medications to the area as directed by the doctor such as polysporin neosporin.  IF YOUR SKIN BECOMES IRRITATED WHILE USING THESE INSTRUCTIONS, IT IS OKAY TO SWITCH TO  WHITE VINEGAR AND WATER. Or you may use antibacterial soap and water to keep the toe clean  Monitor for any signs/symptoms of infection. Call the office immediately if any occur or go directly to the emergency room. Call with any questions/concerns.    [facility] Instructions-Post Nail Surgery  You have had your ingrown toenail and root treated with a chemical.  This chemical causes a burn that will drain and ooze like a blister.  This can drain for 6-8 weeks or longer.  It is important to keep this area clean, covered, and follow the soaking instructions dispensed at the time of your surgery.  This area will eventually dry and form a scab.  Once the scab forms you no longer need to soak or apply a dressing.  If at any time you experience an increase in pain, redness, swelling, or drainage, you should contact the office as soon as possible.    Apply neosporin and a bandaid to the bottom of the toe

## 2022-07-23 NOTE — Progress Notes (Signed)
  Subjective:  Patient ID: [name] [name], female    DOB: May 20, 1965,  MRN: [id number]  Chief Complaint  Patient presents with   Hammer Toe    Follow-up nail removal and flexor tenotomy    57 y.o. female presents with the above complaint. History confirmed with patient.  She presents for follow-up today, the offloading pads were not helpful she would like to proceed with flexor tenotomy and removal of the nail  Objective:  Physical Exam: warm, good capillary refill, no trophic changes or ulcerative lesions, normal DP and PT pulses, normal sensory exam, and left fourth toenail dystrophic, in a adductovarus rotation.   Radiographs: Multiple views x-ray of the left foot: no fracture, dislocation, swelling or degenerative changes noted and digital contractures Assessment:   1. Hammertoe of left foot   2. Dystrophic nail      Plan:  Patient was evaluated and treated and all questions answered.  We discussed the procedure in detail.  All risk benefits and potential complications were discussed.  All questions were addressed prior to the procedure.  Informed consent was signed and reviewed.  The left fourth toe was then anesthetized with 2 cc each of 2% lidocaine and 0.5% Marcaine plain.  Following sterile prep with Betadine and application of a tourniquet at the base of the toe, #11 blade was used to create a flexor tenotomy of the long flexor tendon at the level of the middle phalanx.  Adequate release of the tendon was noted.  I then turned my attention to the dorsal portion of the toe where a Soil scientist was used to remove the nail plate of the fourth toe which was severely dystrophic.  3 applications of phenolic acid were applied to the toe for 30 seconds each.  This was irrigated with alcohol.  A sterile bandage with bacitracin was applied around the toe.  Cortisporin drops sent to pharmacy she will use Neosporin and a bandage on the plantar portion.  She will return for 4 weeks for postop  visit for checkup  Return in about 4 weeks (around 08/16/2022) for tenotomy post op / nail re-check.

## 2022-08-26 ENCOUNTER — Other Ambulatory Visit: Payer: Self-pay | Admitting: Adult Health

## 2022-09-05 ENCOUNTER — Ambulatory Visit: Payer: 59 | Admitting: Podiatry

## 2022-09-05 DIAGNOSIS — L603 Nail dystrophy: Secondary | ICD-10-CM

## 2022-09-05 DIAGNOSIS — M2042 Other hammer toe(s) (acquired), left foot: Secondary | ICD-10-CM

## 2022-09-05 NOTE — Progress Notes (Signed)
  Subjective:  Patient ID: [name] [name], female    DOB: 08/02/1965,  MRN: [id number]  Chief Complaint  Patient presents with   Nail Problem    57 y.o. female presents with the above complaint. History confirmed with patient.  She presents for follow-up today after undergoing flexor tenotomy and removal of fourth digit  Objective:  Physical Exam: warm, good capillary refill, no trophic changes or ulcerative lesions, normal DP and PT pulses, normal sensory exam, and clinically healed site noted of the fourth nailbed.  Rectus alignment of the fourth toe noted   Radiographs: Multiple views x-ray of the left foot: no fracture, dislocation, swelling or degenerative changes noted and digital contractures Assessment:   No diagnosis found.    Plan:  Patient was evaluated and treated and all questions answered.  History of 4 digit flexor tenotomy and total nail avulsion -Clinically healed functioning well reduction and correction of toe noted if any foot and ankle issues or in the future she will come see me or Dr. Abbott Pao she states understanding  No follow-ups on file.

## 2022-09-11 ENCOUNTER — Other Ambulatory Visit (HOSPITAL_COMMUNITY): Payer: Self-pay | Admitting: Family Medicine

## 2022-09-11 DIAGNOSIS — Z1231 Encounter for screening mammogram for malignant neoplasm of breast: Secondary | ICD-10-CM

## 2022-09-17 ENCOUNTER — Encounter (HOSPITAL_COMMUNITY): Payer: Self-pay

## 2022-09-17 ENCOUNTER — Ambulatory Visit (HOSPITAL_COMMUNITY)
Admission: RE | Admit: 2022-09-17 | Discharge: 2022-09-17 | Disposition: A | Discharge status: Home / Self Care | Payer: 59 | Source: Ambulatory Visit | Attending: Family Medicine | Admitting: Family Medicine

## 2022-09-17 DIAGNOSIS — Z1231 Encounter for screening mammogram for malignant neoplasm of breast: Secondary | ICD-10-CM | POA: Insufficient documentation

## 2022-10-19 DIAGNOSIS — F419 Anxiety disorder, unspecified: Secondary | ICD-10-CM | POA: Diagnosis not present

## 2022-11-03 DIAGNOSIS — Z8249 Family history of ischemic heart disease and other diseases of the circulatory system: Secondary | ICD-10-CM | POA: Diagnosis not present

## 2022-11-03 DIAGNOSIS — M1990 Unspecified osteoarthritis, unspecified site: Secondary | ICD-10-CM | POA: Diagnosis not present

## 2022-11-03 DIAGNOSIS — Z79890 Hormone replacement therapy (postmenopausal): Secondary | ICD-10-CM | POA: Diagnosis not present

## 2022-11-03 DIAGNOSIS — F419 Anxiety disorder, unspecified: Secondary | ICD-10-CM | POA: Diagnosis not present

## 2022-11-03 DIAGNOSIS — E119 Type 2 diabetes mellitus without complications: Secondary | ICD-10-CM | POA: Diagnosis not present

## 2022-11-03 DIAGNOSIS — E785 Hyperlipidemia, unspecified: Secondary | ICD-10-CM | POA: Diagnosis not present

## 2022-11-03 DIAGNOSIS — Z803 Family history of malignant neoplasm of breast: Secondary | ICD-10-CM | POA: Diagnosis not present

## 2022-11-03 DIAGNOSIS — Z882 Allergy status to sulfonamides status: Secondary | ICD-10-CM | POA: Diagnosis not present

## 2022-11-03 DIAGNOSIS — E039 Hypothyroidism, unspecified: Secondary | ICD-10-CM | POA: Diagnosis not present

## 2022-11-03 DIAGNOSIS — K219 Gastro-esophageal reflux disease without esophagitis: Secondary | ICD-10-CM | POA: Diagnosis not present

## 2022-12-25 ENCOUNTER — Ambulatory Visit (INDEPENDENT_AMBULATORY_CARE_PROVIDER_SITE_OTHER): Payer: 59

## 2022-12-25 ENCOUNTER — Ambulatory Visit
Admission: EM | Admit: 2022-12-25 | Discharge: 2022-12-25 | Disposition: A | Discharge status: Home / Self Care | Payer: 59 | Attending: Nurse Practitioner | Admitting: Nurse Practitioner

## 2022-12-25 DIAGNOSIS — R1030 Lower abdominal pain, unspecified: Secondary | ICD-10-CM

## 2022-12-25 DIAGNOSIS — R109 Unspecified abdominal pain: Secondary | ICD-10-CM

## 2022-12-25 DIAGNOSIS — R197 Diarrhea, unspecified: Secondary | ICD-10-CM | POA: Diagnosis not present

## 2022-12-25 LAB — POCT URINALYSIS DIP (MANUAL ENTRY)
Bilirubin, UA: NEGATIVE
Glucose, UA: NEGATIVE mg/dL
Ketones, POC UA: NEGATIVE mg/dL
Leukocytes, UA: NEGATIVE
Nitrite, UA: NEGATIVE
Protein Ur, POC: NEGATIVE mg/dL
Spec Grav, UA: <=1.005 — AB (ref 1.010–1.025)
Urobilinogen, UA: 0.2 E.U./dL
pH, UA: 6 (ref 5.0–8.0)

## 2022-12-25 MED ORDER — SIMETHICONE 125 MG PO CHEW
125.0000 mg | CHEWABLE_TABLET | Freq: Four times a day (QID) | ORAL | 0 refills | Status: DC | PRN
Start: 2022-12-25 — End: 2023-05-21

## 2022-12-25 MED ORDER — LOPERAMIDE HCL 2 MG PO CAPS: 2.0000 mg | ORAL_CAPSULE | Freq: Four times a day (QID) | ORAL | 0 refills | Status: DC | PRN

## 2022-12-25 NOTE — Discharge Instructions (Addendum)
The x-ray of your abdomen does show you have increased gas in the bowel.   Take medication as prescribed. Increase fluids.  Try to drink at least 8-10 8 ounce glasses of water daily. Recommend a brat diet until symptoms improved.  This includes bananas, rice, applesauce, and toast. May take over-the-counter Tylenol or ibuprofen as needed for pain, fever, or general discomfort. If you develop worsening symptoms to include worsening diarrhea, abdominal pain, or bloating, please go to the emergency department immediately for further evaluation. Follow-up as needed.

## 2022-12-25 NOTE — ED Provider Notes (Signed)
[facility]    CSN: [id number] Arrival date & time: 12/25/22  1820      History   Chief Complaint No chief complaint on file.   HPI [name] [name] is a 57 y.o. female.   The history is provided by the patient.   Patient presents for complaints of diarrhea, cough, headache, and abdominal pain.  Symptoms started over the past 3 days.  Patient states over the past several hours, she has noticed increased pain in her abdomen.  She states when she coughed, she has a headache.  She denies fever, chills, chest pain, nausea, constipation, or urinary symptoms.  Patient reports that she has had several episodes of diarrhea since her symptoms started.  Reports her last bowel movement was approximately 3 to 4 days ago.  She reports she has been taking Kaopectate for her symptoms with minimal relief.  Past Medical History:  Diagnosis Date   Anxiety    GERD (gastroesophageal reflux disease)    Hormone replacement therapy (HRT) 02/15/2015   Hot flashes 02/15/2015   HSV (herpes simplex virus) infection    Hyperlipidemia    Lichen sclerosus 06/07/2015   Moody 02/15/2015   Postmenopausal 02/15/2015   Vitiligo     Patient Active Problem List   Diagnosis Date Noted   Smoker 04/13/2022   Encounter for well woman exam with routine gynecological exam 04/10/2021   Encounter for screening fecal occult blood testing 03/04/2020   Encounter for gynecological examination with Papanicolaou smear of cervix 03/04/2020   Lichen sclerosus et atrophicus 08/13/2019   Burning with urination 08/13/2019   Vaginal atrophy 08/13/2019   Hematuria 08/13/2019   Lichen sclerosus 06/07/2015   Hot flashes 02/15/2015   Moody 02/15/2015   Postmenopausal 02/15/2015   Right knee pain 05/21/2013   Meniscal cyst 05/21/2013   Derangement of posterior horn of medial meniscus 05/21/2013   Arthritis of right knee 05/21/2013    Past Surgical History:  Procedure Laterality Date   ECTOPIC PREGNANCY SURGERY     X  2   FINGER SURGERY     TONSILLECTOMY      OB History     Gravida  3   Para      Term      Preterm      AB  3   Living         SAB  1   IAB      Ectopic  2   Multiple      Live Births               Home Medications    Prior to Admission medications   Medication Sig Start Date End Date Taking? Authorizing Provider  loperamide (IMODIUM) 2 MG capsule Take 1 capsule (2 mg total) by mouth 4 (four) times daily as needed for diarrhea or loose stools. 12/25/22  Yes Savas Elvin-Warren, Sadie Haber, NP  simethicone (GAS RELIEF EXTRA STRENGTH) 125 MG chewable tablet Chew 1 tablet (125 mg total) by mouth every 6 (six) hours as needed for flatulence. 12/25/22  Yes Avagrace Botelho-Warren, Sadie Haber, NP  acyclovir (ZOVIRAX) 400 MG tablet Take 1 tablet (400 mg total) by mouth 3 (three) times daily. For 5 days Patient not taking: Reported on 04/13/2022 04/10/21   Adline Potter, NP  ALPRAZolam Prudy Feeler) 1 MG tablet Take 1 mg by mouth at bedtime as needed. For sleep    [provider]  Cholecalciferol (VITAMIN D3 PO) Take by mouth.    [provider]  citalopram (CELEXA) 10 MG tablet Take by mouth.    [provider]  citalopram (CELEXA) 40 MG tablet Take 40 mg by mouth daily.    [provider]  clobetasol cream (TEMOVATE) 0.05 % APPLY CREAM 2-3 TIMES WEEKLY 08/27/22   Cyril Mourning A, NP  cyclobenzaprine (FLEXERIL) 10 MG tablet Take 10 mg by mouth 3 (three) times daily. 01/31/21   [provider]  esomeprazole (NEXIUM) 20 MG capsule Take 20 mg by mouth daily at 12 noon.    [provider]  estradiol (ESTRACE) 0.1 MG/GM vaginal cream Use 0.5 gm in vagina twice weekly at bedtime 04/13/22   Cyril Mourning A, NP  ibuprofen (ADVIL,MOTRIN) 200 MG tablet Take 400-600 mg by mouth every 6 (six) hours as needed for fever, mild pain or moderate pain.    [provider]  Levothyroxine Sodium (EUTHYROX PO) Take by mouth.    [provider]  neomycin-polymyxin-hydrocortisone (CORTISPORIN) 3.5-10000-1 OTIC suspension Apply 1-2 drops daily after soaking and cover with bandaid 07/19/22   McDonald, Rachelle Hora, DPM  rosuvastatin (CRESTOR) 10 MG tablet Take 10 mg by mouth at bedtime. 12/05/19   [provider]    Family History Family History  Problem Relation Age of Onset   Heart attack Mother    Heart disease Father    Other Sister        dysplasia   Alcohol abuse Brother    Other Brother        auto immune disease   Leukemia Maternal Grandmother        rare form   Breast cancer Sister     Social History Social History   Tobacco Use   Smoking status: Every Day    Current packs/day: 1.00    Average packs/day: 1 pack/day for 35.0 years (35.0 ttl pk-yrs)    Types: Cigarettes   Smokeless tobacco: Never  Vaping Use   Vaping status: Never Used  Substance Use Topics   Alcohol use: Yes    Comment: socially   Drug use: No     Allergies   Sulfa antibiotics   Review of Systems Review of Systems Per HPI  Physical Exam Triage Vital Signs ED Triage Vitals  Encounter Vitals Group     BP 12/25/22 1844 (!) 140/77     Systolic BP Percentile --      Diastolic BP Percentile --      Pulse Rate 12/25/22 1844 77     Resp 12/25/22 1844 12     Temp 12/25/22 1844 98 F (36.7 C)     Temp Source 12/25/22 1844 Oral     SpO2 12/25/22 1844 95 %     Weight --      Height --      Head Circumference --      Peak Flow --      Pain Score 12/25/22 1846 4     Pain Loc --      Pain Education --      Exclude from Growth Chart --    No data found.  Updated Vital Signs BP (!) 140/77 (BP Location: Right Arm)   Pulse 77   Temp 98 F (36.7 C) (Oral)   Resp 12   SpO2 95%   Visual Acuity Right Eye Distance:   Left Eye Distance:   Bilateral Distance:    Right Eye Near:   Left Eye Near:    Bilateral Near:     Physical Exam Vitals  and nursing note reviewed.  Constitutional:      Appearance: Normal  appearance.  HENT:     Head: Normocephalic.     Mouth/Throat:     Mouth: Mucous membranes are moist.  Eyes:     Extraocular Movements: Extraocular movements intact.     Conjunctiva/sclera: Conjunctivae normal.     Pupils: Pupils are equal, round, and reactive to light.  Cardiovascular:     Rate and Rhythm: Normal rate and regular rhythm.     Pulses: Normal pulses.     Heart sounds: Normal heart sounds.  Pulmonary:     Effort: Pulmonary effort is normal. No respiratory distress.     Breath sounds: Normal breath sounds. No stridor. No wheezing, rhonchi or rales.  Abdominal:     General: Bowel sounds are increased. There is distension.     Palpations: Abdomen is soft.     Tenderness: There is abdominal tenderness in the right upper quadrant and left upper quadrant.  Musculoskeletal:     Cervical back: Normal range of motion.  Lymphadenopathy:     Cervical: No cervical adenopathy.  Skin:    General: Skin is warm and dry.  Neurological:     General: No focal deficit present.     Mental Status: She is alert and oriented to person, place, and time.  Psychiatric:        Mood and Affect: Mood normal.        Behavior: Behavior normal.      UC Treatments / Results  Labs (all labs ordered are listed, but only abnormal results are displayed) Labs Reviewed  POCT URINALYSIS DIP (MANUAL ENTRY) - Abnormal; Notable for the following components:      Result Value   Spec Grav, UA <=1.005 (*)    Blood, UA trace-intact (*)    All other components within normal limits    EKG   Radiology DG Abd 2 Views  Result Date: 12/25/2022 CLINICAL DATA:  Abdominal pain and diarrhea for several days, initial encounter EXAM: ABDOMEN - 2 VIEW COMPARISON:  None Available. FINDINGS: Scattered large and small bowel gas is noted. No obstructive changes are seen. No free air is noted. No abnormal mass or abnormal calcifications are noted. Degenerative changes of lumbar spine are seen. IMPRESSION: No acute  abnormality noted. Electronically Signed   By: Alcide Clever M.D.   On: 12/25/2022 19:41    Procedures Procedures (including critical care time)  Medications Ordered in UC Medications - No data to display  Initial Impression / Assessment and Plan / UC Course  I have reviewed the triage vital signs and the nursing notes.  Pertinent labs & imaging results that were available during my care of the patient were reviewed by me and considered in my medical decision making (see chart for details).  The patient is well-appearing, she is in no acute distress, vital signs are stable.  On exam, patient does have  mild abdominal distention.  X-ray of the abdomen does not show any acute processes; however, patient does have moderate large and small bowel gas, most likely causing her bloating and distention. Do not suspect acute abdomen as patient's vital signs are stable, she is well-appearing, and she is in no acute distress.  Symptomatic treatment provided with Imodium 2 mg capsules for diarrhea and simethicone chewable tablets 125 mg for gas and bloating.  Supportive care recommendations were provided and discussed with the patient to include increasing fluids, a brat diet, and over-the-counter analgesics for pain or  discomfort.  Patient was advised to go to the emergency department immediately if she experiences worsening abdominal pain, diarrhea, or other concerns.  Patient is in agreement with this plan of care and verbalizes understanding.  All questions were answered.  Patient stable for discharge.  Final Clinical Impressions(s) / UC Diagnoses   Final diagnoses:  Diarrhea, unspecified type  Abdominal pain, unspecified abdominal location     Discharge Instructions      The x-ray of your abdomen does show you have increased gas in the bowel.   Take medication as prescribed. Increase fluids.  Try to drink at least 8-10 8 ounce glasses of water daily. Recommend a brat diet until symptoms  improved.  This includes bananas, rice, applesauce, and toast. May take over-the-counter Tylenol or ibuprofen as needed for pain, fever, or general discomfort. If you develop worsening symptoms to include worsening diarrhea, abdominal pain, or bloating, please go to the emergency department immediately for further evaluation. Follow-up as needed.     ED Prescriptions     Medication Sig Dispense Auth. Provider   simethicone (GAS RELIEF EXTRA STRENGTH) 125 MG chewable tablet Chew 1 tablet (125 mg total) by mouth every 6 (six) hours as needed for flatulence. 30 tablet Makaylyn Sinyard-Warren, Sadie Haber, NP   loperamide (IMODIUM) 2 MG capsule Take 1 capsule (2 mg total) by mouth 4 (four) times daily as needed for diarrhea or loose stools. 12 capsule Peyson Delao-Warren, Sadie Haber, NP      PDMP not reviewed this encounter.   Abran Cantor, NP 12/25/22 2027

## 2022-12-25 NOTE — ED Triage Notes (Signed)
Pt c/o diarrhea, cough and a headache, x 3 days, cough and headache started yesterday.

## 2022-12-31 ENCOUNTER — Other Ambulatory Visit: Payer: Self-pay | Admitting: Adult Health

## 2023-01-24 DIAGNOSIS — F419 Anxiety disorder, unspecified: Secondary | ICD-10-CM | POA: Diagnosis not present

## 2023-01-24 DIAGNOSIS — Z6824 Body mass index (BMI) 24.0-24.9, adult: Secondary | ICD-10-CM | POA: Diagnosis not present

## 2023-01-24 DIAGNOSIS — F330 Major depressive disorder, recurrent, mild: Secondary | ICD-10-CM | POA: Diagnosis not present

## 2023-04-15 ENCOUNTER — Encounter: Payer: Self-pay | Admitting: Adult Health

## 2023-04-15 ENCOUNTER — Other Ambulatory Visit (HOSPITAL_COMMUNITY)
Admission: RE | Admit: 2023-04-15 | Discharge: 2023-04-15 | Disposition: A | Discharge status: Home / Self Care | Payer: 59 | Source: Ambulatory Visit | Attending: Adult Health | Admitting: Adult Health

## 2023-04-15 ENCOUNTER — Ambulatory Visit (INDEPENDENT_AMBULATORY_CARE_PROVIDER_SITE_OTHER): Payer: 59 | Admitting: Adult Health

## 2023-04-15 VITALS — BP 120/69 | HR 69 | Ht 67.0 in | Wt 164.0 lb

## 2023-04-15 DIAGNOSIS — Z01419 Encounter for gynecological examination (general) (routine) without abnormal findings: Secondary | ICD-10-CM | POA: Insufficient documentation

## 2023-04-15 DIAGNOSIS — Z1211 Encounter for screening for malignant neoplasm of colon: Secondary | ICD-10-CM

## 2023-04-15 DIAGNOSIS — Z780 Asymptomatic menopausal state: Secondary | ICD-10-CM

## 2023-04-15 DIAGNOSIS — L900 Lichen sclerosus et atrophicus: Secondary | ICD-10-CM | POA: Diagnosis not present

## 2023-04-15 DIAGNOSIS — N952 Postmenopausal atrophic vaginitis: Secondary | ICD-10-CM | POA: Diagnosis not present

## 2023-04-15 LAB — HEMOCCULT GUIAC POC 1CARD (OFFICE): Fecal Occult Blood, POC: NEGATIVE

## 2023-04-15 MED ORDER — ESTRADIOL 0.1 MG/GM VA CREA: TOPICAL_CREAM | VAGINAL | 1 refills | Status: DC

## 2023-04-15 NOTE — Progress Notes (Signed)
Patient ID: [name] [name], female   DOB: May 18, 1965, 57 y.o.   MRN: [id number] History of Present Illness: [name] [name] is a 57 year old white female,divorced, PM in for a well woman gyn exam and pap.  PCP is Dr [name] [name].   Current Medications, Allergies, Past Medical History, Past Surgical History, Family History and Social History were reviewed in [name] [name] record.     Review of Systems: Patient denies any headaches, hearing loss, fatigue, blurred vision, shortness of breath, chest pain, abdominal pain, problems with bowel movements, urination, or intercourse(not active). No joint pain or [name] swings.  She denies any vaginal bleeding.   Physical Exam:BP 120/69 (BP Location: Left Arm, Patient Position: Sitting, Cuff Size: Normal)   Pulse 69   Ht 5\' 7"  (1.702 m)   Wt 164 lb (74.4 kg)   BMI 25.69 kg/m   General:  Well developed, well nourished, no acute distress Skin:  Warm and dry Neck:  Midline trachea, normal thyroid, good ROM, no lymphadenopathy Lungs; Clear to auscultation bilaterally Breast:  No dominant palpable mass, retraction, or nipple discharge Cardiovascular: Regular rate and rhythm Abdomen:  Soft, non tender, no hepatosplenomegaly Pelvic:  External genitalia is normal in appearance, no lesions.  The vagina is pale and atrophic. Urethra has no lesions or masses. The cervix is smooth, pap with HR HPV genotyping performed.  Uterus is felt to be normal size, shape, and contour.  No adnexal masses or tenderness noted.Bladder is non tender, no masses felt. Rectal: Good sphincter tone, no polyps, or hemorrhoids felt.  Hemoccult negative. Extremities/musculoskeletal:  No swelling or varicosities noted, no clubbing or cyanosis, has red scaly area right inner thigh, that has been there for a while has seen dermatology. Psych:  No [name] changes, alert and cooperative,seems happy AA is 4 Fall risk is low    04/15/2023   11:29 AM 04/13/2022   11:38 AM 04/10/2021     2:24 PM  Depression screen PHQ 2/9  Decreased Interest 0 1 1  Down, Depressed, Hopeless 0 1 1  PHQ - 2 Score 0 2 2  Altered sleeping 3 3 3   Tired, decreased energy 1 1 1   Change in appetite 2 0 0  Feeling bad or failure about yourself  0 0 0  Trouble concentrating 0 0 0  Moving slowly or fidgety/restless 0 1 0  Suicidal thoughts 0 0 0  PHQ-9 Score 6 7 6        04/15/2023   11:29 AM 04/13/2022   11:39 AM 04/10/2021    2:24 PM 03/04/2020   12:39 PM  GAD 7 : Generalized Anxiety Score  Nervous, Anxious, on Edge 2 1 1 2   Control/stop worrying 0 1 1 1   Worry too much - different things 1 1 1 1   Trouble relaxing 0 1 1 1   Restless 0 0 0 0  Easily annoyed or irritable 1 1 1 2   Afraid - awful might happen 0 0 0 0  Total GAD 7 Score 4 5 5 7       Upstream - 04/15/23 1141       Pregnancy Intention Screening   Does the patient want to become pregnant in the next year? N/A    Does the patient's partner want to become pregnant in the next year? N/A    Would the patient like to discuss contraceptive options today? N/A      Contraception Wrap Up   Current Method Abstinence   PM   End Method  Abstinence   PM   Contraception Counseling Provided No            Examination chaperoned by [name] Mood LPN   Impression and Plan: 1. Encounter for gynecological examination with Papanicolaou smear of cervix Pap sent Pap in 3 years if normal Physical in 1 year Mammogram was negative 09/17/22 Labs with PCP, this Friday Had cologuard with PCP  - Cytology - PAP( [facility])  2. Encounter for screening fecal occult blood testing Hemoccult was negative  - POCT occult blood stool  3. Lichen sclerosus et atrophicus Has temovate at home   4. Vaginal atrophy Has estrace vaginal cream at home   5. Postmenopausal Denies any vaginal bleeding

## 2023-04-17 LAB — CYTOLOGY - PAP
Comment: NEGATIVE
Diagnosis: NEGATIVE
High risk HPV: NEGATIVE

## 2023-04-26 DIAGNOSIS — R7309 Other abnormal glucose: Secondary | ICD-10-CM | POA: Diagnosis not present

## 2023-04-26 DIAGNOSIS — Z0000 Encounter for general adult medical examination without abnormal findings: Secondary | ICD-10-CM | POA: Diagnosis not present

## 2023-04-26 DIAGNOSIS — R7303 Prediabetes: Secondary | ICD-10-CM | POA: Diagnosis not present

## 2023-04-26 DIAGNOSIS — M1991 Primary osteoarthritis, unspecified site: Secondary | ICD-10-CM | POA: Diagnosis not present

## 2023-04-26 DIAGNOSIS — Z6825 Body mass index (BMI) 25.0-25.9, adult: Secondary | ICD-10-CM | POA: Diagnosis not present

## 2023-04-26 DIAGNOSIS — E063 Autoimmune thyroiditis: Secondary | ICD-10-CM | POA: Diagnosis not present

## 2023-04-26 DIAGNOSIS — E785 Hyperlipidemia, unspecified: Secondary | ICD-10-CM | POA: Diagnosis not present

## 2023-04-26 DIAGNOSIS — E039 Hypothyroidism, unspecified: Secondary | ICD-10-CM | POA: Diagnosis not present

## 2023-04-26 DIAGNOSIS — F5101 Primary insomnia: Secondary | ICD-10-CM | POA: Diagnosis not present

## 2023-04-26 DIAGNOSIS — F411 Generalized anxiety disorder: Secondary | ICD-10-CM | POA: Diagnosis not present

## 2023-04-26 DIAGNOSIS — E782 Mixed hyperlipidemia: Secondary | ICD-10-CM | POA: Diagnosis not present

## 2023-05-02 ENCOUNTER — Encounter (INDEPENDENT_AMBULATORY_CARE_PROVIDER_SITE_OTHER): Payer: Self-pay | Admitting: *Deleted

## 2023-05-21 ENCOUNTER — Telehealth: Payer: Self-pay | Admitting: *Deleted

## 2023-05-21 NOTE — Telephone Encounter (Signed)
  Procedure: Colonoscopy  Height: 5'7 Weight: 164lbs       Have you had a colonoscopy before?  no  Do you have family history of colon cancer?  no  Do you have a family history of polyps? no  Previous colonoscopy with polyps removed? no  Do you have a history colorectal cancer?   no  Are you diabetic?  no  Do you have a prosthetic or mechanical heart valve? no  Do you have a pacemaker/defibrillator?   no  Have you had endocarditis/atrial fibrillation?  no  Do you use supplemental oxygen/CPAP?  no  Have you had joint replacement within the last 12 months?  no  Do you tend to be constipated or have to use laxatives?  no   Do you have history of alcohol use? If yes, how much and how often.  Yes social  Do you have history or are you using drugs? If yes, what do are you  using?  no  Have you ever had a stroke/heart attack?  no  Have you ever had a heart or other vascular stent placed,?no  Do you take weight loss medication? no  female patients,: have you had a hysterectomy? no                              are you post menopausal?  yes                              do you still have your menstrual cycle? no    Date of last menstrual period?   Do you take any blood-thinning medications such as: (Plavix, aspirin, Coumadin, Aggrenox, Brilinta, Xarelto, Eliquis, Pradaxa, Savaysa or Effient)? no  If yes we need the name, milligram, dosage and who is prescribing doctor:               Current Outpatient Medications  Medication Sig Dispense Refill   clonazePAM (KLONOPIN) 2 MG tablet Take 2 mg by mouth 2 (two) times daily.     clobetasol  cream (TEMOVATE ) 0.05 % APPLY CREAM 2-3 TIMES WEEKLY 30 g 3   escitalopram (LEXAPRO) 20 MG tablet Take 20 mg by mouth daily.     estradiol  (ESTRACE ) 0.1 MG/GM vaginal cream Use 0.5 gm in vagina twice weekly at bedtime 43 g 1   levothyroxine (SYNTHROID) 50 MCG tablet Take 50 mcg by mouth daily.     rosuvastatin (CRESTOR) 20 MG tablet Take 10 mg  by mouth daily.     No current facility-administered medications for this visit.    Allergies  Allergen Reactions   Sulfa Antibiotics Nausea Only and Other (See Comments)    Stomach pain

## 2023-06-12 ENCOUNTER — Encounter (INDEPENDENT_AMBULATORY_CARE_PROVIDER_SITE_OTHER): Payer: Self-pay | Admitting: *Deleted

## 2023-06-12 MED ORDER — PEG 3350-KCL-NA BICARB-NACL 420 G PO SOLR: 4000.0000 mL | Freq: Once | ORAL | 0 refills | Status: AC

## 2023-06-12 NOTE — Telephone Encounter (Signed)
Referral completed, [facility] apt letter sent to PCP

## 2023-06-12 NOTE — Telephone Encounter (Signed)
Spoke with pt and she has been scheduled for 3/7. Needs a Friday in march. Aware will send instructions. Rx for prep sent to pharmacy

## 2023-06-12 NOTE — Addendum Note (Signed)
Addended by: Armstead Peaks on: 06/12/2023 09:16 AM   Modules accepted: Orders

## 2023-07-19 ENCOUNTER — Ambulatory Visit (HOSPITAL_COMMUNITY): Payer: Self-pay | Admitting: Anesthesiology

## 2023-07-19 ENCOUNTER — Encounter (HOSPITAL_COMMUNITY): Payer: Self-pay | Admitting: Internal Medicine

## 2023-07-19 ENCOUNTER — Ambulatory Visit (HOSPITAL_COMMUNITY)
Admission: RE | Admit: 2023-07-19 | Discharge: 2023-07-19 | Disposition: A | Discharge status: Home / Self Care | Payer: 59 | Attending: Internal Medicine | Admitting: Internal Medicine

## 2023-07-19 ENCOUNTER — Other Ambulatory Visit: Payer: Self-pay

## 2023-07-19 ENCOUNTER — Ambulatory Visit (HOSPITAL_BASED_OUTPATIENT_CLINIC_OR_DEPARTMENT_OTHER): Payer: Self-pay | Admitting: Anesthesiology

## 2023-07-19 ENCOUNTER — Encounter (HOSPITAL_COMMUNITY)
Admission: RE | Disposition: A | Discharge status: Home / Self Care | Payer: Self-pay | Source: Home / Self Care | Attending: Internal Medicine

## 2023-07-19 DIAGNOSIS — D120 Benign neoplasm of cecum: Secondary | ICD-10-CM | POA: Insufficient documentation

## 2023-07-19 DIAGNOSIS — K5730 Diverticulosis of large intestine without perforation or abscess without bleeding: Secondary | ICD-10-CM | POA: Diagnosis not present

## 2023-07-19 DIAGNOSIS — Z1211 Encounter for screening for malignant neoplasm of colon: Secondary | ICD-10-CM | POA: Insufficient documentation

## 2023-07-19 DIAGNOSIS — I10 Essential (primary) hypertension: Secondary | ICD-10-CM | POA: Insufficient documentation

## 2023-07-19 DIAGNOSIS — K6289 Other specified diseases of anus and rectum: Secondary | ICD-10-CM

## 2023-07-19 DIAGNOSIS — D128 Benign neoplasm of rectum: Secondary | ICD-10-CM | POA: Diagnosis not present

## 2023-07-19 DIAGNOSIS — K640 First degree hemorrhoids: Secondary | ICD-10-CM | POA: Insufficient documentation

## 2023-07-19 DIAGNOSIS — F17290 Nicotine dependence, other tobacco product, uncomplicated: Secondary | ICD-10-CM | POA: Insufficient documentation

## 2023-07-19 DIAGNOSIS — K635 Polyp of colon: Secondary | ICD-10-CM | POA: Diagnosis not present

## 2023-07-19 DIAGNOSIS — K219 Gastro-esophageal reflux disease without esophagitis: Secondary | ICD-10-CM | POA: Diagnosis not present

## 2023-07-19 DIAGNOSIS — F17210 Nicotine dependence, cigarettes, uncomplicated: Secondary | ICD-10-CM | POA: Insufficient documentation

## 2023-07-19 DIAGNOSIS — E039 Hypothyroidism, unspecified: Secondary | ICD-10-CM | POA: Diagnosis not present

## 2023-07-19 DIAGNOSIS — Z139 Encounter for screening, unspecified: Secondary | ICD-10-CM | POA: Diagnosis not present

## 2023-07-19 HISTORY — PX: POLYPECTOMY: SHX5525

## 2023-07-19 HISTORY — PX: COLONOSCOPY WITH PROPOFOL: SHX5780

## 2023-07-19 HISTORY — DX: Cardiac murmur, unspecified: R01.1

## 2023-07-19 HISTORY — DX: Hypothyroidism, unspecified: E03.9

## 2023-07-19 SURGERY — COLONOSCOPY WITH PROPOFOL
Anesthesia: General

## 2023-07-19 MED ORDER — PROPOFOL 500 MG/50ML IV EMUL
INTRAVENOUS | Status: DC | PRN
  Administered 2023-07-19: 125 ug/kg/min via INTRAVENOUS

## 2023-07-19 MED ORDER — LIDOCAINE HCL (CARDIAC) PF 100 MG/5ML IV SOSY
PREFILLED_SYRINGE | INTRAVENOUS | Status: DC | PRN
Start: 2023-07-19 — End: 2023-07-19
  Administered 2023-07-19: 60 mg via INTRATRACHEAL

## 2023-07-19 MED ORDER — LIDOCAINE HCL (PF) 2 % IJ SOLN
INTRAMUSCULAR | Status: AC
  Filled 2023-07-19: qty 5

## 2023-07-19 MED ORDER — PHENYLEPHRINE 80 MCG/ML (10ML) SYRINGE FOR IV PUSH (FOR BLOOD PRESSURE SUPPORT)
PREFILLED_SYRINGE | INTRAVENOUS | Status: DC | PRN
  Administered 2023-07-19: 80 ug via INTRAVENOUS

## 2023-07-19 MED ORDER — LACTATED RINGERS IV SOLN: INTRAVENOUS | Status: DC

## 2023-07-19 MED ORDER — PROPOFOL 10 MG/ML IV BOLUS
INTRAVENOUS | Status: DC | PRN
  Administered 2023-07-19: 70 mg via INTRAVENOUS

## 2023-07-19 MED ORDER — PROPOFOL 500 MG/50ML IV EMUL
INTRAVENOUS | Status: AC
  Filled 2023-07-19: qty 50

## 2023-07-19 NOTE — Transfer of Care (Addendum)
 Immediate Anesthesia Transfer of Care Note  Patient: [name] [name]  Procedure(s) Performed: COLONOSCOPY WITH PROPOFOL POLYPECTOMY  Patient Location: Endoscopy Unit  Anesthesia Type:General  Level of Consciousness: drowsy and patient cooperative  Airway & Oxygen Therapy: Patient Spontanous Breathing and Patient connected to nasal cannula oxygen  Post-op Assessment: Report given to RN and Post -op Vital signs reviewed and stable  Post vital signs: Reviewed and stable  Last Vitals:  Vitals Value Taken Time  BP 103/57 07/19/23   0755  Temp 36.5 07/19/23   0755  Pulse 60 07/19/23   0755  Resp 16 07/19/23   0755  SpO2 97% 07/19/23   0755    Last Pain:  Vitals:   07/19/23 0729  TempSrc:   PainSc: 0-No pain      Patients Stated Pain Goal: 5 (07/19/23 2956)  Complications: No notable events documented.

## 2023-07-19 NOTE — Anesthesia Postprocedure Evaluation (Signed)
 Anesthesia Post Note  Patient: [name] [name]  Procedure(s) Performed: COLONOSCOPY WITH PROPOFOL POLYPECTOMY  Patient location during evaluation: Phase II Anesthesia Type: General Level of consciousness: awake Pain management: pain level controlled Vital Signs Assessment: post-procedure vital signs reviewed and stable Respiratory status: spontaneous breathing and respiratory function stable Cardiovascular status: blood pressure returned to baseline and stable Postop Assessment: no headache and no apparent nausea or vomiting Anesthetic complications: no Comments: Late entry   No notable events documented.   Last Vitals:  Vitals:   07/19/23 0700 07/19/23 0755  BP: (!) 110/53 (!) 103/57  Pulse: 62 60  Resp: 16 16  Temp: 36.6 C 36.5 C  SpO2: 94% 97%    Last Pain:  Vitals:   07/19/23 0755  TempSrc: Oral  PainSc: 0-No pain                 Windell Norfolk

## 2023-07-19 NOTE — Discharge Instructions (Signed)
  Colonoscopy Discharge Instructions  Read the instructions outlined below and refer to this sheet in the next few weeks. These discharge instructions provide you with general information on caring for yourself after you leave the hospital. Your doctor may also give you specific instructions. While your treatment has been planned according to the most current medical practices available, unavoidable complications occasionally occur. If you have any problems or questions after discharge, call Dr. Jena Gauss at [phone]. ACTIVITY You may resume your regular activity, but move at a slower pace for the next 24 hours.  Take frequent rest periods for the next 24 hours.  Walking will help get rid of the air and reduce the bloated feeling in your belly (abdomen).  No driving for 24 hours (because of the medicine (anesthesia) used during the test).   Do not sign any important legal documents or operate any machinery for 24 hours (because of the anesthesia used during the test).  NUTRITION Drink plenty of fluids.  You may resume your normal diet as instructed by your doctor.  Begin with a light meal and progress to your normal diet. Heavy or fried foods are harder to digest and may make you feel sick to your stomach (nauseated).  Avoid alcoholic beverages for 24 hours or as instructed.  MEDICATIONS You may resume your normal medications unless your doctor tells you otherwise.  WHAT YOU CAN EXPECT TODAY Some feelings of bloating in the abdomen.  Passage of more gas than usual.  Spotting of blood in your stool or on the toilet paper.  IF YOU HAD POLYPS REMOVED DURING THE COLONOSCOPY: No aspirin products for 7 days or as instructed.  No alcohol for 7 days or as instructed.  Eat a soft diet for the next 24 hours.  FINDING OUT THE RESULTS OF YOUR TEST Not all test results are available during your visit. If your test results are not back during the visit, make an appointment with your caregiver to find out the  results. Do not assume everything is normal if you have not heard from your caregiver or the medical facility. It is important for you to follow up on all of your test results.  SEEK IMMEDIATE MEDICAL ATTENTION IF: You have more than a spotting of blood in your stool.  Your belly is swollen (abdominal distention).  You are nauseated or vomiting.  You have a temperature over 101.  You have abdominal pain or discomfort that is severe or gets worse throughout the day.     2 small polyps found and removed today  Colon polyp and diverticulosis information provided   further recommendations to follow pending review of pathology report  At patient request, called Candie Echevaria at [phone] -  reviewed findings and recommendations

## 2023-07-19 NOTE — H&P (Signed)
 @LOGO @   Primary Care Physician:  Assunta Found, MD Primary Gastroenterologist:  Dr. Jena Gauss  Pre-Procedure History & Physical: HPI:  [name] [name] is a 58 y.o. female is here for a screening colonoscopy.  First ever average risk screening examination.  Past Medical History:  Diagnosis Date   Anxiety    GERD (gastroesophageal reflux disease)    Heart murmur    Hormone replacement therapy (HRT) 02/15/2015   Hot flashes 02/15/2015   HSV (herpes simplex virus) infection    Hyperlipidemia    Hypothyroidism    Lichen sclerosus 06/07/2015   Moody 02/15/2015   Postmenopausal 02/15/2015   Vitiligo     Past Surgical History:  Procedure Laterality Date   ECTOPIC PREGNANCY SURGERY     X 2   ESOPHAGOGASTRODUODENOSCOPY     FINGER SURGERY     TOE SURGERY     TONSILLECTOMY      Prior to Admission medications   Medication Sig Start Date End Date Taking? Authorizing Provider  clobetasol cream (TEMOVATE) 0.05 % APPLY CREAM 2-3 TIMES WEEKLY 08/27/22  Yes Cyril Mourning A, NP  clonazePAM (KLONOPIN) 2 MG tablet Take 2 mg by mouth 2 (two) times daily.   Yes [provider]  escitalopram (LEXAPRO) 20 MG tablet Take 20 mg by mouth daily. 04/13/23  Yes [provider]  esomeprazole (NEXIUM) 20 MG capsule Take 20 mg by mouth daily at 12 noon.   Yes [provider]  estradiol (ESTRACE) 0.1 MG/GM vaginal cream Use 0.5 gm in vagina twice weekly at bedtime 04/15/23  Yes Cyril Mourning A, NP  levothyroxine (SYNTHROID) 50 MCG tablet Take 50 mcg by mouth daily. 04/13/23  Yes [provider]  rosuvastatin (CRESTOR) 20 MG tablet Take 10 mg by mouth daily. 01/24/23  Yes [provider]    Allergies as of 06/12/2023 - Review Complete 05/21/2023  Allergen Reaction Noted   Sulfa antibiotics Nausea Only and Other (See Comments) 08/13/2019    Family History  Problem Relation Age of Onset   Heart attack Mother    Heart disease Father    Other Sister         dysplasia   Alcohol abuse Brother    Other Brother        auto immune disease   Leukemia Maternal Grandmother        rare form   Breast cancer Sister     Social History   Socioeconomic History   Marital status: Divorced    Spouse name: Not on file   Number of children: Not on file   Years of education: Not on file   Highest education level: Not on file  Occupational History   Not on file  Tobacco Use   Smoking status: Every Day    Current packs/day: 1.00    Average packs/day: 1 pack/day for 35.0 years (35.0 ttl pk-yrs)    Types: Cigarettes   Smokeless tobacco: Never  Vaping Use   Vaping status: Some Days  Substance and Sexual Activity   Alcohol use: Yes    Comment: socially   Drug use: No   Sexual activity: Not Currently    Birth control/protection: Post-menopausal  Other Topics Concern   Not on file  Social History Narrative   Not on file   Social Drivers of Health   Financial Resource Strain: Low Risk  (04/15/2023)   Overall Financial Resource Strain (CARDIA)    Difficulty of Paying Living Expenses: Not very hard  Food Insecurity: No Food  Insecurity (04/15/2023)   Hunger Vital Sign    Worried About Running Out of Food in the Last Year: Never true    Ran Out of Food in the Last Year: Never true  Transportation Needs: No Transportation Needs (04/15/2023)   PRAPARE - Administrator, Civil Service (Medical): No    Lack of Transportation (Non-Medical): No  Physical Activity: Inactive (04/15/2023)   Exercise Vital Sign    Days of Exercise per Week: 0 days    Minutes of Exercise per Session: 0 min  Stress: Stress Concern Present (04/15/2023)   [employer] of Occupational Health - Occupational Stress Questionnaire    Feeling of Stress : Very much  Social Connections: Moderately Integrated (04/15/2023)   Social Connection and Isolation Panel [NHANES]    Frequency of Communication with Friends and Family: Three times a week    Frequency of Social  Gatherings with Friends and Family: Once a week    Attends Religious Services: 1 to 4 times per year    Active Member of [employer] or Organizations: No    Attends Banker Meetings: Never    Marital Status: Living with partner  Intimate Partner Violence: Not At Risk (04/15/2023)   Humiliation, Afraid, Rape, and Kick questionnaire    Fear of Current or Ex-Partner: No    Emotionally Abused: No    Physically Abused: No    Sexually Abused: No    Review of Systems: See HPI, otherwise negative ROS  Physical Exam: BP (!) 110/53   Pulse 62   Temp 97.8 F (36.6 C) (Oral)   Resp 16   Ht 5\' 7"  (1.702 m)   Wt 74.4 kg   SpO2 94%   BMI 25.69 kg/m  General:   Alert,  Well-developed, well-nourished, pleasant and cooperative in NAD Head:  Normocephalic and atraumatic. Lungs:  Clear throughout to auscultation.   No wheezes, crackles, or rhonchi. No acute distress. Heart:  Regular rate and rhythm; no murmurs, clicks, rubs,  or gallops. Abdomen:  Soft, nontender and nondistended. No masses, hepatosplenomegaly or hernias noted. Normal bowel sounds, without guarding, and without rebound.   Msk:  Symmetrical without gross deformities. Normal posture. Pulses:  Normal pulses noted.  Impression/Plan: [name] [name] is now here to undergo a screening colonoscopy.  First ever average risk screening examination  Risks, benefits, limitations, imponderables and alternatives regarding colonoscopy have been reviewed with the patient. Questions have been answered. All parties agreeable.     Notice:  This dictation was prepared with Dragon dictation along with smaller phrase technology. Any transcriptional errors that result from this process are unintentional and may not be corrected upon review.

## 2023-07-19 NOTE — Anesthesia Preprocedure Evaluation (Signed)
 Anesthesia Evaluation  Patient identified by MRN, date of birth, ID band Patient awake    Reviewed: Allergy & Precautions, H&P , NPO status , Patient's Chart, lab work & pertinent test results, reviewed documented beta blocker date and time   Airway Mallampati: II  TM Distance: >3 FB Neck ROM: full    Dental no notable dental hx.    Pulmonary neg pulmonary ROS, Current Smoker   Pulmonary exam normal breath sounds clear to auscultation       Cardiovascular Exercise Tolerance: Good hypertension, negative cardio ROS + Valvular Problems/Murmurs  Rhythm:regular Rate:Normal     Neuro/Psych   Anxiety     negative neurological ROS  negative psych ROS   GI/Hepatic negative GI ROS, Neg liver ROS,GERD  ,,  Endo/Other  negative endocrine ROSHypothyroidism    Renal/GU negative Renal ROS  negative genitourinary   Musculoskeletal   Abdominal   Peds  Hematology negative hematology ROS (+)   Anesthesia Other Findings   Reproductive/Obstetrics negative OB ROS                             Anesthesia Physical Anesthesia Plan  ASA: 2  Anesthesia Plan: General   Post-op Pain Management:    Induction:   PONV Risk Score and Plan: Propofol infusion  Airway Management Planned:   Additional Equipment:   Intra-op Plan:   Post-operative Plan:   Informed Consent: I have reviewed the patients History and Physical, chart, labs and discussed the procedure including the risks, benefits and alternatives for the proposed anesthesia with the patient or authorized representative who has indicated his/her understanding and acceptance.     Dental Advisory Given  Plan Discussed with: CRNA  Anesthesia Plan Comments:        Anesthesia Quick Evaluation

## 2023-07-19 NOTE — Op Note (Signed)
 [facility] Patient Name: [name] [name] Procedure Date: 07/19/2023 7:11 AM MRN: [id number] Date of Birth: 05-Dec-1965 Attending MD: Gennette Pac , MD, [id number] CSN: [id number] Age: 58 Admit Type: Outpatient Procedure:                Colonoscopy Indications:              Screening for colorectal malignant neoplasm Providers:                Gennette Pac, MD, Sheran Fava, Lennice Sites Technician, Technician Referring MD:              Medicines:                Propofol per Anesthesia Complications:            No immediate complications. Estimated Blood Loss:     Estimated blood loss was minimal. Procedure:                Pre-Anesthesia Assessment:                           - Prior to the procedure, a History and Physical                            was performed, and patient medications and                            allergies were reviewed. The patient's tolerance of                            previous anesthesia was also reviewed. The risks                            and benefits of the procedure and the sedation                            options and risks were discussed with the patient.                            All questions were answered, and informed consent                            was obtained. Prior Anticoagulants: The patient has                            taken no anticoagulant or antiplatelet agents. ASA                            Grade Assessment: II - A patient with mild systemic                            disease. After reviewing the risks and benefits,  the patient was deemed in satisfactory condition to                            undergo the procedure.                           After obtaining informed consent, the colonoscope                            was passed under direct vision. Throughout the                            procedure, the patient's blood pressure, pulse, and                             oxygen saturations were monitored continuously. The                            [id number]) scope was introduced through the                            anus and advanced to the the cecum, identified by                            appendiceal orifice and ileocecal valve. The                            colonoscopy was performed without difficulty. The                            patient tolerated the procedure well. The quality                            of the bowel preparation was adequate. The                            ileocecal valve, appendiceal orifice, and rectum                            were photographed. Scope In: 7:33:40 AM Scope Out: 7:48:21 AM Scope Withdrawal Time: 0 hours 10 minutes 32 seconds  Total Procedure Duration: 0 hours 14 minutes 41 seconds  Findings:      The perianal and digital rectal examinations were normal.      Non-bleeding internal hemorrhoids were found during retroflexion. The       hemorrhoids were moderate, medium-sized and Grade I (internal       hemorrhoids that do not prolapse). Anal papilla also present.      Scattered medium-mouthed diverticula were found in the entire colon.      Two sessile polyps were found in the rectum, mid rectum and ileocecal       valve. The polyps were 3 to 4 mm in size. These polyps were removed with       a cold snare. Resection and retrieval were complete. Estimated blood       loss was minimal.  The exam was otherwise without abnormality on direct and retroflexion       views. Impression:               - Non-bleeding internal hemorrhoids.                           - Diverticulosis in the entire examined colon.                           - Two 3 to 4 mm polyps in the rectum, in the mid                            rectum and at the ileocecal valve, removed with a                            cold snare. Resected and retrieved.                           - The examination was otherwise normal on direct                             and retroflexion views. Moderate Sedation:      Moderate (conscious) sedation was personally administered by an       anesthesia professional. The following parameters were monitored: oxygen       saturation, heart rate, blood pressure, and response to care. Recommendation:           - Patient has a contact number available for                            emergencies. The signs and symptoms of potential                            delayed complications were discussed with the                            patient. Return to normal activities tomorrow.                            Written discharge instructions were provided to the                            patient.                           - Advance diet as tolerated.                           - Continue present medications.                           - Repeat colonoscopy date to be determined after                            pending pathology results are reviewed for  surveillance.                           - Return to GI office (date not yet determined). Procedure Code(s):        --- Professional ---                           [phone], Colonoscopy, flexible; with removal of                            tumor(s), polyp(s), or other lesion(s) by snare                            technique Diagnosis Code(s):        --- Professional ---                           Z12.11, Encounter for screening for malignant                            neoplasm of colon                           K64.0, First degree hemorrhoids                           D12.8, Benign neoplasm of rectum                           D12.0, Benign neoplasm of cecum                           K57.30, Diverticulosis of large intestine without                            perforation or abscess without bleeding CPT copyright 2022 American Medical Association. All rights reserved. The codes documented in this report are preliminary and upon coder  review may  be revised to meet current compliance requirements. Gerrit Friends. Ancil Dewan, MD Gennette Pac, MD 07/19/2023 7:54:29 AM This report has been signed electronically. Number of Addenda: 0

## 2023-07-22 ENCOUNTER — Encounter (HOSPITAL_COMMUNITY): Payer: Self-pay | Admitting: Internal Medicine

## 2023-07-22 ENCOUNTER — Encounter: Payer: Self-pay | Admitting: Internal Medicine

## 2023-07-22 LAB — SURGICAL PATHOLOGY

## 2023-08-05 DIAGNOSIS — F419 Anxiety disorder, unspecified: Secondary | ICD-10-CM | POA: Diagnosis not present

## 2023-09-30 ENCOUNTER — Other Ambulatory Visit (HOSPITAL_COMMUNITY): Payer: Self-pay | Admitting: Family Medicine

## 2023-09-30 DIAGNOSIS — Z1231 Encounter for screening mammogram for malignant neoplasm of breast: Secondary | ICD-10-CM

## 2023-10-14 ENCOUNTER — Ambulatory Visit (HOSPITAL_COMMUNITY)

## 2023-10-28 ENCOUNTER — Ambulatory Visit (HOSPITAL_COMMUNITY)
Admission: RE | Admit: 2023-10-28 | Discharge: 2023-10-28 | Disposition: A | Discharge status: Home / Self Care | Source: Ambulatory Visit | Attending: Family Medicine | Admitting: Family Medicine

## 2023-10-28 DIAGNOSIS — Z1231 Encounter for screening mammogram for malignant neoplasm of breast: Secondary | ICD-10-CM | POA: Diagnosis not present

## 2023-11-06 ENCOUNTER — Other Ambulatory Visit: Payer: Self-pay | Admitting: Adult Health

## 2023-11-28 DIAGNOSIS — F411 Generalized anxiety disorder: Secondary | ICD-10-CM | POA: Diagnosis not present

## 2023-11-28 DIAGNOSIS — Z6825 Body mass index (BMI) 25.0-25.9, adult: Secondary | ICD-10-CM | POA: Diagnosis not present

## 2023-11-28 DIAGNOSIS — E663 Overweight: Secondary | ICD-10-CM | POA: Diagnosis not present

## 2023-11-28 DIAGNOSIS — M1991 Primary osteoarthritis, unspecified site: Secondary | ICD-10-CM | POA: Diagnosis not present

## 2023-11-28 DIAGNOSIS — M5412 Radiculopathy, cervical region: Secondary | ICD-10-CM | POA: Diagnosis not present

## 2023-12-15 ENCOUNTER — Telehealth: Payer: Self-pay | Admitting: Internal Medicine

## 2023-12-15 NOTE — Telephone Encounter (Signed)
 Chart audit reveals no documentation the patient was ever given path report.  If there is no documentation, please let patient know the polyps removed from her colon were benign.  It is recommended she return in 7 years for repeat colonoscopy.

## 2023-12-16 NOTE — Telephone Encounter (Signed)
 Pt was made aware via mailed letter.

## 2023-12-20 ENCOUNTER — Encounter (HOSPITAL_COMMUNITY): Payer: Self-pay | Admitting: Emergency Medicine

## 2023-12-20 ENCOUNTER — Other Ambulatory Visit: Payer: Self-pay

## 2023-12-20 ENCOUNTER — Emergency Department (HOSPITAL_COMMUNITY)
Admission: EM | Admit: 2023-12-20 | Discharge: 2023-12-20 | Disposition: A | Discharge status: Home / Self Care | Attending: Emergency Medicine | Admitting: Emergency Medicine

## 2023-12-20 DIAGNOSIS — K529 Noninfective gastroenteritis and colitis, unspecified: Secondary | ICD-10-CM | POA: Diagnosis not present

## 2023-12-20 DIAGNOSIS — R197 Diarrhea, unspecified: Secondary | ICD-10-CM | POA: Diagnosis not present

## 2023-12-20 LAB — URINALYSIS, ROUTINE W REFLEX MICROSCOPIC
Bilirubin Urine: NEGATIVE
Glucose, UA: NEGATIVE mg/dL
Hgb urine dipstick: NEGATIVE
Ketones, ur: NEGATIVE mg/dL
Leukocytes,Ua: NEGATIVE
Nitrite: NEGATIVE
Protein, ur: NEGATIVE mg/dL
Specific Gravity, Urine: 1.024 (ref 1.005–1.030)
pH: 5 (ref 5.0–8.0)

## 2023-12-20 LAB — LIPASE, BLOOD: Lipase: 27 U/L (ref 11–51)

## 2023-12-20 LAB — CBC
HCT: 39.7 % (ref 36.0–46.0)
Hemoglobin: 13.7 g/dL (ref 12.0–15.0)
MCH: 31.1 pg (ref 26.0–34.0)
MCHC: 34.5 g/dL (ref 30.0–36.0)
MCV: 90.2 fL (ref 80.0–100.0)
Platelets: 267 K/uL (ref 150–400)
RBC: 4.4 MIL/uL (ref 3.87–5.11)
RDW: 12.5 % (ref 11.5–15.5)
WBC: 6.4 K/uL (ref 4.0–10.5)
nRBC: 0 % (ref 0.0–0.2)

## 2023-12-20 LAB — COMPREHENSIVE METABOLIC PANEL WITH GFR
ALT: 20 U/L (ref 0–44)
AST: 20 U/L (ref 15–41)
Albumin: 3.9 g/dL (ref 3.5–5.0)
Alkaline Phosphatase: 59 U/L (ref 38–126)
Anion gap: 10 (ref 5–15)
BUN: 13 mg/dL (ref 6–20)
CO2: 25 mmol/L (ref 22–32)
Calcium: 8.9 mg/dL (ref 8.9–10.3)
Chloride: 106 mmol/L (ref 98–111)
Creatinine, Ser: 0.5 mg/dL (ref 0.44–1.00)
GFR, Estimated: >60 mL/min (ref >60)
Glucose, Bld: 128 mg/dL — ABNORMAL HIGH (ref 70–99)
Potassium: 3.4 mmol/L — ABNORMAL LOW (ref 3.5–5.1)
Sodium: 141 mmol/L (ref 135–145)
Total Bilirubin: 0.9 mg/dL (ref 0.0–1.2)
Total Protein: 6.8 g/dL (ref 6.5–8.1)

## 2023-12-20 LAB — POC OCCULT BLOOD, ED: Fecal Occult Blood: POSITIVE — AB

## 2023-12-20 MED ORDER — ONDANSETRON 4 MG PO TBDP: ORAL_TABLET | ORAL | 0 refills | Status: AC

## 2023-12-20 MED ORDER — KETOROLAC TROMETHAMINE 30 MG/ML IJ SOLN
30.0000 mg | Freq: Once | INTRAMUSCULAR | Status: AC
  Administered 2023-12-20: 30 mg via INTRAVENOUS
  Filled 2023-12-20: qty 1

## 2023-12-20 MED ORDER — SODIUM CHLORIDE 0.9 % IV BOLUS
1000.0000 mL | Freq: Once | INTRAVENOUS | Status: AC
  Administered 2023-12-20: 1000 mL via INTRAVENOUS

## 2023-12-20 MED ORDER — METRONIDAZOLE 500 MG PO TABS: 500.0000 mg | ORAL_TABLET | Freq: Two times a day (BID) | ORAL | 0 refills | Status: DC

## 2023-12-20 MED ORDER — ONDANSETRON HCL 4 MG/2ML IJ SOLN
4.0000 mg | Freq: Once | INTRAMUSCULAR | Status: AC
  Administered 2023-12-20: 4 mg via INTRAVENOUS
  Filled 2023-12-20: qty 2

## 2023-12-20 MED ORDER — CIPROFLOXACIN HCL 500 MG PO TABS: 500.0000 mg | ORAL_TABLET | Freq: Two times a day (BID) | ORAL | 0 refills | Status: DC

## 2023-12-20 NOTE — ED Triage Notes (Signed)
 Pt states she has had abd pain,nausea and diarrhea x5 days.

## 2023-12-20 NOTE — Discharge Instructions (Signed)
 Drink plenty of fluids.  If you have another diarrheal stool you can bring in a specimen to the lab.  Follow-up with your family doctor next week

## 2023-12-21 NOTE — ED Provider Notes (Signed)
 [facility] EMERGENCY DEPARTMENT AT [facility] Provider Note   CSN: [id number] Arrival date & time: 12/20/23  1007     Patient presents with: Diarrhea   [name] [name] is a 58 y.o. female.   Patient complains of diarrhea.  No abdominal pain.  The diarrhea seems to be improving  The history is provided by the patient and medical records.  Diarrhea Quality:  Watery Severity:  Mild Onset quality:  Sudden Timing:  Intermittent Progression:  Improving Relieved by:  Nothing Worsened by:  Nothing Ineffective treatments:  None tried Associated symptoms: no abdominal pain and no headaches        Prior to Admission medications   Medication Sig Start Date End Date Taking? Authorizing Provider  ciprofloxacin  (CIPRO ) 500 MG tablet Take 1 tablet (500 mg total) by mouth 2 (two) times daily. One po bid x 7 days 12/20/23  Yes Vaunda Gutterman, MD  metroNIDAZOLE  (FLAGYL ) 500 MG tablet Take 1 tablet (500 mg total) by mouth 2 (two) times daily. 12/20/23  Yes Meaghen Vecchiarelli, MD  ondansetron  (ZOFRAN -ODT) 4 MG disintegrating tablet 4mg  ODT q4 hours prn nausea/vomit 12/20/23  Yes Journey Castonguay, MD  clobetasol  cream (TEMOVATE ) 0.05 % APPLY CREAM 2 -3 TIMES WEEKLY 11/06/23   Signa Delon LABOR, NP  clonazePAM (KLONOPIN) 2 MG tablet Take 2 mg by mouth 2 (two) times daily.    [provider]  escitalopram (LEXAPRO) 20 MG tablet Take 20 mg by mouth daily. 04/13/23   [provider]  esomeprazole (NEXIUM) 20 MG capsule Take 20 mg by mouth daily at 12 noon.    [provider]  estradiol  (ESTRACE ) 0.1 MG/GM vaginal cream Use 0.5 gm in vagina twice weekly at bedtime 04/15/23   Signa Delon LABOR, NP  levothyroxine (SYNTHROID) 50 MCG tablet Take 50 mcg by mouth daily. 04/13/23   [provider]  rosuvastatin (CRESTOR) 20 MG tablet Take 10 mg by mouth daily. 01/24/23   [provider]    Allergies: Sulfa antibiotics    Review of Systems  Constitutional:   Negative for appetite change and fatigue.  HENT:  Negative for congestion, ear discharge and sinus pressure.   Eyes:  Negative for discharge.  Respiratory:  Negative for cough.   Cardiovascular:  Negative for chest pain.  Gastrointestinal:  Positive for diarrhea. Negative for abdominal pain.  Genitourinary:  Negative for frequency and hematuria.  Musculoskeletal:  Negative for back pain.  Skin:  Negative for rash.  Neurological:  Negative for seizures and headaches.  Psychiatric/Behavioral:  Negative for hallucinations.     Updated Vital Signs BP 105/61   Pulse 74   Temp 98.2 F (36.8 C) (Oral)   Resp 16   Ht 5' 7 (1.702 m)   Wt 75 kg   SpO2 98%   BMI 25.90 kg/m   Physical Exam Vitals and nursing note reviewed.  Constitutional:      Appearance: She is well-developed.  HENT:     Head: Normocephalic.     Nose: Nose normal.  Eyes:     General: No scleral icterus.    Conjunctiva/sclera: Conjunctivae normal.  Neck:     Thyroid : No thyromegaly.  Cardiovascular:     Rate and Rhythm: Normal rate and regular rhythm.     Heart sounds: No murmur heard.    No friction rub. No gallop.  Pulmonary:     Breath sounds: No stridor. No wheezing or rales.  Chest:     Chest wall: No tenderness.  Abdominal:     General: There is no distension.     Tenderness: There is no abdominal tenderness. There is no rebound.  Musculoskeletal:        General: Normal range of motion.     Cervical back: Neck supple.  Lymphadenopathy:     Cervical: No cervical adenopathy.  Skin:    Findings: No erythema or rash.  Neurological:     Mental Status: She is alert and oriented to person, place, and time.     Motor: No abnormal muscle tone.     Coordination: Coordination normal.  Psychiatric:        Behavior: Behavior normal.     (all labs ordered are listed, but only abnormal results are displayed) Labs Reviewed  COMPREHENSIVE METABOLIC PANEL WITH GFR - Abnormal; Notable for the following  components:      Result Value   Potassium 3.4 (*)    Glucose, Bld 128 (*)    All other components within normal limits  URINALYSIS, ROUTINE W REFLEX MICROSCOPIC - Abnormal; Notable for the following components:   APPearance HAZY (*)    All other components within normal limits  POC OCCULT BLOOD, ED - Abnormal; Notable for the following components:   Fecal Occult Blood POSITIVE (*)    All other components within normal limits  GASTROINTESTINAL PANEL BY PCR, STOOL (REPLACES STOOL CULTURE)  LIPASE, BLOOD  CBC    EKG: None  Radiology: No results found.   Procedures   Medications Ordered in the ED  sodium chloride  0.9 % bolus 1,000 mL (0 mLs Intravenous Stopped 12/20/23 1252)  ketorolac  (TORADOL ) 30 MG/ML injection 30 mg (30 mg Intravenous Given 12/20/23 1126)  ondansetron  (ZOFRAN ) injection 4 mg (4 mg Intravenous Given 12/20/23 1126)                                    Medical Decision Making Amount and/or Complexity of Data Reviewed Labs: ordered.  Risk Prescription drug management.   Patient with gastroenteritis most likely secondary to viral cause.  Patient will follow-up with PCP     Final diagnoses:  Gastroenteritis    ED Discharge Orders          Ordered    ciprofloxacin  (CIPRO ) 500 MG tablet  2 times daily        12/20/23 1412    metroNIDAZOLE  (FLAGYL ) 500 MG tablet  2 times daily        12/20/23 1412    ondansetron  (ZOFRAN -ODT) 4 MG disintegrating tablet        12/20/23 1412               Suzette Pac, MD 12/21/23 1715

## 2023-12-26 ENCOUNTER — Other Ambulatory Visit (HOSPITAL_COMMUNITY)
Admission: RE | Admit: 2023-12-26 | Discharge: 2023-12-26 | Disposition: A | Discharge status: Home / Self Care | Source: Ambulatory Visit | Attending: Emergency Medicine | Admitting: Emergency Medicine

## 2023-12-26 ENCOUNTER — Inpatient Hospital Stay (HOSPITAL_COMMUNITY): Admit: 2023-12-26

## 2023-12-26 DIAGNOSIS — K3189 Other diseases of stomach and duodenum: Secondary | ICD-10-CM | POA: Insufficient documentation

## 2023-12-27 LAB — GASTROINTESTINAL PANEL BY PCR, STOOL (REPLACES STOOL CULTURE)

## 2023-12-30 ENCOUNTER — Other Ambulatory Visit: Payer: Self-pay | Admitting: Adult Health

## 2024-02-11 ENCOUNTER — Telehealth: Payer: Self-pay

## 2024-02-11 NOTE — Telephone Encounter (Signed)
 Copied from CRM [id number]. Topic: Clinical - Medical Advice >> Feb 11, 2024  2:14 PM Debby BROCKS wrote: Reason for CRM: Patient would like to know if she should schedule blood work now or wait on until she is seen for her new patient appointment and then schedule the end of year blood work.  She would also like to know how she can refill her medication in the future but before the dec 15th appointment since her old practice closed down and she wont have a PCP until dec 15th

## 2024-02-13 ENCOUNTER — Telehealth: Payer: Self-pay

## 2024-02-13 NOTE — Telephone Encounter (Signed)
 Patient called and advised to call her old PCP at his new location to see if she can have a virtual visit with him to receive the refills enough until her upcoming visit in December or she can drive to [facility] [employer] on 02/18/24 to see a provider there, address/times given. She verbalized understanding.   Copied from CRM [id number]. Topic: Clinical - Medication Question >> Feb 13, 2024  3:17 PM Delon HERO wrote: Reason for CRM: Patient is calling to ask where so can go for medication Refills until 04/27/24

## 2024-03-26 ENCOUNTER — Other Ambulatory Visit: Payer: Self-pay | Admitting: Adult Health

## 2024-04-02 ENCOUNTER — Other Ambulatory Visit: Payer: Self-pay | Admitting: Adult Health

## 2024-04-03 ENCOUNTER — Telehealth: Payer: Self-pay | Admitting: *Deleted

## 2024-04-03 NOTE — Telephone Encounter (Signed)
 Pt called stating her pharmacy won't refill her Estradiol  cream. JAG sent in a refill on 11/14. Pt states the last time she got it filled was in August. Pharmacy will not fill it now, stating it is to early and she should have plenty. Pharmacy states pt would have to pay out of pocket. Pt was advised to call pharmacy and ask what's the soonest they would fill it and go from there. Pt voiced understanding. JSY

## 2024-04-16 ENCOUNTER — Ambulatory Visit: Admitting: Obstetrics & Gynecology

## 2024-04-16 ENCOUNTER — Encounter: Payer: Self-pay | Admitting: Obstetrics & Gynecology

## 2024-04-16 VITALS — BP 136/67 | HR 69 | Ht 67.0 in | Wt 167.0 lb

## 2024-04-16 DIAGNOSIS — N952 Postmenopausal atrophic vaginitis: Secondary | ICD-10-CM | POA: Diagnosis not present

## 2024-04-16 DIAGNOSIS — B009 Herpesviral infection, unspecified: Secondary | ICD-10-CM

## 2024-04-16 DIAGNOSIS — F419 Anxiety disorder, unspecified: Secondary | ICD-10-CM

## 2024-04-16 DIAGNOSIS — N904 Leukoplakia of vulva: Secondary | ICD-10-CM | POA: Diagnosis not present

## 2024-04-16 DIAGNOSIS — Z01411 Encounter for gynecological examination (general) (routine) with abnormal findings: Secondary | ICD-10-CM

## 2024-04-16 MED ORDER — VALACYCLOVIR HCL 500 MG PO TABS: ORAL_TABLET | ORAL | 2 refills | Status: AC

## 2024-04-16 MED ORDER — ESCITALOPRAM OXALATE 20 MG PO TABS: 20.0000 mg | ORAL_TABLET | Freq: Every day | ORAL | 1 refills | Status: DC

## 2024-04-16 MED ORDER — CLOBETASOL PROPIONATE 0.05 % EX CREA: TOPICAL_CREAM | CUTANEOUS | 2 refills | Status: AC

## 2024-04-16 NOTE — Progress Notes (Signed)
 WELL-WOMAN EXAMINATION Patient name: [name] [name] MRN [id number]  Date of birth: 1966-04-28 Chief Complaint:   Gynecologic Exam  History of Present Illness:   [name] [name] is a 58 y.o. G3P0030 PM female being seen today for a routine well-woman exam.   Previously seen by J.Griffin.  Pt being managed for the following chronic conditions:  LS- using clobetasol  twice per week.  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  She notes needing a refill on acyclovir  as well  Vaginal atrophy and using estradiol  cream.  Using 2x per week and working well.  She made note an occasional itch or irritation, but sometimes she is not sure which of the above is causing these issues.  Assured patient that this is quite normal since most of these present with similar symptoms.  No vaginal bleeding, no discharge.  No urinary concerns.  No fever or chills.    S sexually active with same partner though currently not having intercourse due to issues as above  No LMP recorded. Patient is postmenopausal.  The current method of family planning is post menopausal status.    Last pap 04/2023.  Last mammogram: 10/2023. Last colonoscopy: 07/2023     04/15/2023   11:29 AM 04/13/2022   11:38 AM 04/10/2021    2:24 PM 03/04/2020   12:38 PM 08/13/2019    2:06 PM  Depression screen PHQ 2/9  Decreased Interest 0 1 1 2 1   Down, Depressed, Hopeless 0 1 1 2 1   PHQ - 2 Score 0 2 2 4 2   Altered sleeping 3 3 3 1 3   Tired, decreased energy 1 1 1 1 1   Change in appetite 2 0 0 0 0  Feeling bad or failure about yourself  0 0 0 0 0  Trouble concentrating 0 0 0 0 0  Moving slowly or fidgety/restless 0 1 0 0 1  Suicidal thoughts 0 0 0 0 0  PHQ-9 Score 6  7  6  6  7    Difficult doing work/chores     Not difficult at all     Data saved with a previous flowsheet row definition      Review of Systems:   Pertinent items are noted in  HPI Denies any headaches, blurred vision, fatigue, shortness of breath, chest pain, abdominal pain, bowel movements, urination, or intercourse unless otherwise stated above.  Pertinent History Reviewed:  Reviewed past medical,surgical, social and family history.  Reviewed problem list, medications and allergies. Physical Assessment:   Vitals:   04/16/24 1326  BP: 136/67  Pulse: 69  Weight: 167 lb (75.8 kg)  Height: 5' 7 (1.702 m)  Body mass index is 26.16 kg/m.        Physical Examination:   General appearance - well appearing, and in no distress  Mental status - alert, oriented to person, place, and time  Psych:  She has a normal mood and affect  Skin - warm and dry, normal color, no suspicious lesions noted  Chest - effort normal, all lung fields clear to auscultation bilaterally  Heart - normal rate and regular rhythm  Neck:  midline trachea, no thyromegaly or nodules  Breasts - breasts appear normal, no suspicious masses, no skin or nipple changes or  axillary nodes  Abdomen - soft, nontender, nondistended, no masses or organomegaly  Pelvic - VULVA: normal appearing vulva with no masses-loss of clitoral  architecture with hypopigmentation and small hair noted.  No tenderness or vulvar lesions seen VAGINA: normal appearing vagina with normal color and discharge, no lesions  CERVIX: normal appearing cervix without discharge or lesions, no CMT  UTERUS: uterus is felt to be normal size, shape, consistency and nontender   ADNEXA: No adnexal masses or tenderness noted.  Extremities:  No swelling or varicosities noted  Chaperone: Alan Fischer     Assessment & Plan:  1) Well-Woman Exam -Pap and other screening guidelines up to date  2) lichen sclerosis  - Discussed use of clobetasol  and can continue using twice weekly or as needed  3) vaginal atrophy  -Continue using vaginal estradiol  cream.  Patient does not need refill  4) history of HSV - Rx sent in  5) Anxiety -  Currently in between providers needs refill on Lexapro until she can be seen next month  No orders of the defined types were placed in this encounter.   Meds:  Meds ordered this encounter  Medications   escitalopram (LEXAPRO) 20 MG tablet    Sig: Take 1 tablet (20 mg total) by mouth daily.    Dispense:  30 tablet    Refill:  1   valACYclovir (VALTREX) 500 MG tablet    Sig: Take one tablet twice daily for 3 days when needed    Dispense:  30 tablet    Refill:  2   clobetasol  cream (TEMOVATE ) 0.05 %    Sig: APPLY pea-sized amount 1-2 TIMES WEEKLY    Dispense:  30 g    Refill:  2    Follow-up: Return in about 1 year (around 04/16/2025) for Annual.   Estelle Skibicki, DO Attending Obstetrician & Gynecologist, [facility], [facility]

## 2024-04-27 ENCOUNTER — Encounter: Payer: Self-pay | Admitting: Physician Assistant

## 2024-04-27 ENCOUNTER — Ambulatory Visit: Admitting: Physician Assistant

## 2024-04-27 VITALS — BP 136/79 | HR 76 | Temp 97.8°F | Ht 67.0 in | Wt 170.2 lb

## 2024-04-27 DIAGNOSIS — E785 Hyperlipidemia, unspecified: Secondary | ICD-10-CM | POA: Insufficient documentation

## 2024-04-27 DIAGNOSIS — F411 Generalized anxiety disorder: Secondary | ICD-10-CM | POA: Diagnosis not present

## 2024-04-27 DIAGNOSIS — Z122 Encounter for screening for malignant neoplasm of respiratory organs: Secondary | ICD-10-CM

## 2024-04-27 DIAGNOSIS — Z7689 Persons encountering health services in other specified circumstances: Secondary | ICD-10-CM

## 2024-04-27 DIAGNOSIS — E782 Mixed hyperlipidemia: Secondary | ICD-10-CM | POA: Diagnosis not present

## 2024-04-27 DIAGNOSIS — E039 Hypothyroidism, unspecified: Secondary | ICD-10-CM

## 2024-04-27 DIAGNOSIS — F5101 Primary insomnia: Secondary | ICD-10-CM | POA: Insufficient documentation

## 2024-04-27 MED ORDER — LEVOTHYROXINE SODIUM 50 MCG PO TABS: 50.0000 ug | ORAL_TABLET | Freq: Every day | ORAL | 1 refills | Status: AC

## 2024-04-27 MED ORDER — BUSPIRONE HCL 7.5 MG PO TABS: 7.5000 mg | ORAL_TABLET | Freq: Two times a day (BID) | ORAL | 1 refills | Status: AC

## 2024-04-27 MED ORDER — ALPRAZOLAM 0.25 MG PO TABS: 0.2500 mg | ORAL_TABLET | Freq: Every day | ORAL | 0 refills | Status: AC

## 2024-04-27 MED ORDER — ROSUVASTATIN CALCIUM 20 MG PO TABS: 10.0000 mg | ORAL_TABLET | Freq: Every day | ORAL | 1 refills | Status: AC

## 2024-04-27 MED ORDER — ALPRAZOLAM 0.5 MG PO TABS: 0.5000 mg | ORAL_TABLET | Freq: Every day | ORAL | 0 refills | Status: AC

## 2024-04-27 NOTE — Assessment & Plan Note (Signed)
 Managed with medication. Overdue for thyroid  function assessment. - Ordered thyroid  function tests.  - Medication refilled today.

## 2024-04-27 NOTE — Assessment & Plan Note (Signed)
 Stable. Continue with current management without changes. Discussed healthy diet and lifestyle. Lipid panel today.

## 2024-04-27 NOTE — Assessment & Plan Note (Signed)
 Long-term alprazolam  use for anxiety and sleep. Concerns about dependence and withdrawal. Discussed tapering and alternative medications like hydroxyzine and Buspar . - Prescribed alprazolam  taper: finish current 1 mg supply, then 0.5 mg at bedtime for 30 days, followed by 0.25 mg at bedtime for 30 days. - Discussed potential use of hydroxyzine post-taper. - Prescribed Buspar  twice daily. - Scheduled follow-up in two months to reassess.

## 2024-04-27 NOTE — Assessment & Plan Note (Signed)
 Insomnia managed with alprazolam . Discussed tapering and alternative sleep aids. - See anxiety A/P for additional details.  - Continue melatonin and Reishi mushroom gummies. - Plan transition to alternative sleep aids post-taper.

## 2024-04-27 NOTE — Progress Notes (Signed)
 New Patient Office Visit  Subjective    Patient ID: [name] [name], female    DOB: 11-05-1965  Age: 58 y.o. MRN: [id number]  CC:  Chief Complaint  Patient presents with   New Patient (Initial Visit)    Patient is a new patient. Wants to talk about her Lexapro  medication  She has a spot on the top of her head wants looked at and maybe a referral to dermo    HPI [name] [name] presents to establish care  Discussed the use of AI scribe software for clinical note transcription with the patient, who gave verbal consent to proceed.  History of Present Illness [name] [name] is a 58 year old female with anxiety and insomnia who presents to establish care and discuss medication management.  She has had anxiety and insomnia since her late twenties and has taken alprazolam  (Xanax ) since the 1990s, initially 2 mg nightly, now self-reduced to 1.5 mg to stretch her supply. She wants to discontinue Xanax  due to concerns about long-term use and withdrawal. Trazodone previously worsened her insomnia. She currently uses an over-the-counter gummy with Reishi mushroom and melatonin for sleep in addition to nightly Xanax . She takes Lexapro  for anxiety. She has not tried hydroxyzine recently and does not recall how effective Buspar  was when used many years ago.  She has hypothyroidism and hyperlipidemia and takes levothyroxine  and rosuvastatin . She is overdue for blood work to monitor these. She has lichen sclerosis and vaginal atrophy and is post-menopausal. She had a mammogram earlier this year.  She has smoked about one pack per day since high school, with more when drinking socially, and also vapes. She drinks wine occasionally and is interested in lung cancer screening because of her smoking history. Surgical history includes two emergency surgeries for ectopic pregnancies with removal of her left ovary, tonsillectomy, and prior nerve and tendon surgery at age 22. Declines flu and shingles vaccine.      Outpatient Encounter Medications as of 04/27/2024  Medication Sig   [START ON 05/27/2024] ALPRAZolam  (XANAX ) 0.25 MG tablet Take 1 tablet (0.25 mg total) by mouth at bedtime.   ALPRAZolam  (XANAX ) 0.5 MG tablet Take 1 tablet (0.5 mg total) by mouth at bedtime.   busPIRone  (BUSPAR ) 7.5 MG tablet Take 1 tablet (7.5 mg total) by mouth 2 (two) times daily.   clobetasol  cream (TEMOVATE ) 0.05 % APPLY pea-sized amount 1-2 TIMES WEEKLY   escitalopram  (LEXAPRO ) 20 MG tablet Take 1 tablet (20 mg total) by mouth daily.   esomeprazole (NEXIUM) 20 MG capsule Take 20 mg by mouth daily at 12 noon.   estradiol  (ESTRACE ) 0.01 % CREA vaginal cream PLACE 0.5 GRAMS INTO THE VAGINA TWICE WEEKLY AT BEDTIME   ondansetron  (ZOFRAN -ODT) 4 MG disintegrating tablet 4mg  ODT q4 hours prn nausea/vomit   valACYclovir  (VALTREX ) 500 MG tablet Take one tablet twice daily for 3 days when needed   [DISCONTINUED] levothyroxine  (SYNTHROID ) 50 MCG tablet Take 50 mcg by mouth daily.   [DISCONTINUED] rosuvastatin  (CRESTOR ) 20 MG tablet Take 10 mg by mouth daily.   levothyroxine  (SYNTHROID ) 50 MCG tablet Take 1 tablet (50 mcg total) by mouth daily.   rosuvastatin  (CRESTOR ) 20 MG tablet Take 0.5 tablets (10 mg total) by mouth daily.   No facility-administered encounter medications on file as of 04/27/2024.    Past Medical History:  Diagnosis Date   Anxiety    GERD (gastroesophageal reflux disease)    Heart murmur    Hormone replacement therapy (HRT) 02/15/2015  Hot flashes 02/15/2015   HSV (herpes simplex virus) infection    Hyperlipidemia    Hypothyroidism    Lichen sclerosus 06/07/2015   Moody 02/15/2015   Postmenopausal 02/15/2015   Vitiligo     Past Surgical History:  Procedure Laterality Date   COLONOSCOPY WITH PROPOFOL  N/A 07/19/2023   Procedure: COLONOSCOPY WITH PROPOFOL ;  Surgeon: Shaaron Lamar HERO, MD;  Location: [facility];  Service: Endoscopy;  Laterality: N/A;  730am, asa 2   ECTOPIC PREGNANCY SURGERY      X 2   ESOPHAGOGASTRODUODENOSCOPY     FINGER SURGERY     POLYPECTOMY  07/19/2023   Procedure: POLYPECTOMY;  Surgeon: Shaaron Lamar HERO, MD;  Location: [facility];  Service: Endoscopy;;   TOE SURGERY     TONSILLECTOMY      Family History  Problem Relation Age of Onset   Heart attack Mother    Heart disease Father    Other Sister        dysplasia   Alcohol abuse Brother    Other Brother        auto immune disease   Leukemia Maternal Grandmother        rare form   Breast cancer Sister     Social History   Socioeconomic History   Marital status: Divorced    Spouse name: Not on file   Number of children: Not on file   Years of education: Not on file   Highest education level: Not on file  Occupational History   Not on file  Tobacco Use   Smoking status: Every Day    Current packs/day: 1.00    Average packs/day: 1 pack/day for 40.0 years (40.0 ttl pk-yrs)    Types: Cigarettes   Smokeless tobacco: Never  Vaping Use   Vaping status: Some Days  Substance and Sexual Activity   Alcohol use: Yes    Comment: socially   Drug use: No   Sexual activity: Not Currently    Birth control/protection: Post-menopausal  Other Topics Concern   Not on file  Social History Narrative   Not on file   Social Drivers of Health   Tobacco Use: High Risk (04/16/2024)   Patient History    Smoking Tobacco Use: Every Day    Smokeless Tobacco Use: Never    Passive Exposure: Not on file  Financial Resource Strain: Low Risk (04/15/2023)   Overall Financial Resource Strain (CARDIA)    Difficulty of Paying Living Expenses: Not very hard  Food Insecurity: No Food Insecurity (04/15/2023)   Hunger Vital Sign    Worried About Running Out of Food in the Last Year: Never true    Ran Out of Food in the Last Year: Never true  Transportation Needs: No Transportation Needs (04/15/2023)   PRAPARE - Administrator, Civil Service (Medical): No    Lack of Transportation (Non-Medical): No   Physical Activity: Inactive (04/15/2023)   Exercise Vital Sign    Days of Exercise per Week: 0 days    Minutes of Exercise per Session: 0 min  Stress: Stress Concern Present (04/15/2023)   [employer] of Occupational Health - Occupational Stress Questionnaire    Feeling of Stress : Very much  Social Connections: Moderately Integrated (04/15/2023)   Social Connection and Isolation Panel    Frequency of Communication with Friends and Family: Three times a week    Frequency of Social Gatherings with Friends and Family: Once a week    Attends Religious Services:  1 to 4 times per year    Active Member of Clubs or Organizations: No    Attends Banker Meetings: Never    Marital Status: Living with partner  Intimate Partner Violence: Not At Risk (04/15/2023)   Humiliation, Afraid, Rape, and Kick questionnaire    Fear of Current or Ex-Partner: No    Emotionally Abused: No    Physically Abused: No    Sexually Abused: No  Depression (PHQ2-9): Medium Risk (04/15/2023)   Depression (PHQ2-9)    PHQ-2 Score: 6  Alcohol Screen: Low Risk (04/15/2023)   Alcohol Screen    Last Alcohol Screening Score (AUDIT): 4  Housing: Low Risk (04/15/2023)   Housing    Last Housing Risk Score: 0  Utilities: Not At Risk (04/15/2023)   AHC Utilities    Threatened with loss of utilities: No  Health Literacy: Not on file    Review of Systems  Constitutional:  Negative for activity change, fatigue and fever.  Eyes:  Negative for visual disturbance.  Respiratory:  Negative for cough and shortness of breath.   Cardiovascular:  Negative for chest pain.  Neurological:  Negative for light-headedness and headaches.  Psychiatric/Behavioral:  Positive for sleep disturbance. Negative for agitation and decreased concentration. The patient is nervous/anxious.       Objective    BP 136/79 (BP Location: Left Arm, Patient Position: Sitting)   Pulse 76   Temp 97.8 F (36.6 C)   Ht 5' 7 (1.702 m)   Wt  170 lb 4 oz (77.2 kg)   SpO2 100%   BMI 26.66 kg/m   Physical Exam Constitutional:      General: She is not in acute distress.    Appearance: Normal appearance. She is obese. She is not ill-appearing.  HENT:     Head: Normocephalic and atraumatic.     Mouth/Throat:     Mouth: Mucous membranes are moist.     Pharynx: Oropharynx is clear.  Eyes:     Extraocular Movements: Extraocular movements intact.     Conjunctiva/sclera: Conjunctivae normal.  Cardiovascular:     Rate and Rhythm: Normal rate and regular rhythm.     Heart sounds: Normal heart sounds. No murmur heard. Pulmonary:     Effort: Pulmonary effort is normal.     Breath sounds: Normal breath sounds. No wheezing, rhonchi or rales.  Skin:    General: Skin is warm and dry.  Neurological:     General: No focal deficit present.     Mental Status: She is alert and oriented to person, place, and time.  Psychiatric:        Mood and Affect: Mood normal.        Behavior: Behavior normal.        Assessment & Plan:  Encounter to establish care  Generalized anxiety disorder Assessment & Plan: Long-term alprazolam  use for anxiety and sleep. Concerns about dependence and withdrawal. Discussed tapering and alternative medications like hydroxyzine and Buspar . - Prescribed alprazolam  taper: finish current 1 mg supply, then 0.5 mg at bedtime for 30 days, followed by 0.25 mg at bedtime for 30 days. - Discussed potential use of hydroxyzine post-taper. - Prescribed Buspar  twice daily. - Scheduled follow-up in two months to reassess.   Orders: -     busPIRone  HCl; Take 1 tablet (7.5 mg total) by mouth 2 (two) times daily.  Dispense: 60 tablet; Refill: 1  Primary insomnia Assessment & Plan: Insomnia managed with alprazolam . Discussed tapering and alternative sleep aids. -  See anxiety A/P for additional details.  - Continue melatonin and Reishi mushroom gummies. - Plan transition to alternative sleep aids  post-taper.  Orders: -     ALPRAZolam ; Take 1 tablet (0.5 mg total) by mouth at bedtime.  Dispense: 30 tablet; Refill: 0 -     ALPRAZolam ; Take 1 tablet (0.25 mg total) by mouth at bedtime.  Dispense: 30 tablet; Refill: 0  Hypothyroidism, unspecified type Assessment & Plan: Managed with medication. Overdue for thyroid  function assessment. - Ordered thyroid  function tests.  - Medication refilled today.   Orders: -     CBC with Differential/Platelet -     TSH + free T4  Mixed hyperlipidemia Assessment & Plan: Stable. Continue with current management without changes. Discussed healthy diet and lifestyle. Lipid panel today.   Orders: -     Lipid panel -     CMP14+EGFR -     CBC with Differential/Platelet  Screening for lung cancer -     Ambulatory Referral for Lung Cancer Scre  Other orders -     Levothyroxine  Sodium; Take 1 tablet (50 mcg total) by mouth daily.  Dispense: 90 tablet; Refill: 1 -     Rosuvastatin  Calcium ; Take 0.5 tablets (10 mg total) by mouth daily.  Dispense: 90 tablet; Refill: 1   Return in about 2 months (around 06/28/2024) for anxiety.   Charmaine Arvel Oquinn, PA-C

## 2024-04-28 LAB — CMP14+EGFR
ALT: 19 IU/L (ref 0–32)
AST: 20 IU/L (ref 0–40)
Albumin: 4.8 g/dL (ref 3.8–4.9)
Alkaline Phosphatase: 67 IU/L (ref 49–135)
BUN/Creatinine Ratio: 18 (ref 12–28)
BUN: 12 mg/dL (ref 8–27)
Bilirubin Total: 0.5 mg/dL (ref 0.0–1.2)
CO2: 26 mmol/L (ref 20–29)
Calcium: 9.6 mg/dL (ref 8.7–10.3)
Chloride: 102 mmol/L (ref 96–106)
Creatinine, Ser: 0.67 mg/dL (ref 0.57–1.00)
Globulin, Total: 2 g/dL (ref 1.5–4.5)
Glucose: 69 mg/dL — ABNORMAL LOW (ref 70–99)
Potassium: 4.1 mmol/L (ref 3.5–5.2)
Sodium: 142 mmol/L (ref 134–144)
Total Protein: 6.8 g/dL (ref 6.0–8.5)
eGFR: 100 mL/min/1.73 (ref >59)

## 2024-04-28 LAB — LIPID PANEL
Chol/HDL Ratio: 3.7 ratio (ref 0.0–4.4)
Cholesterol, Total: 227 mg/dL — ABNORMAL HIGH (ref 100–199)
HDL: 61 mg/dL (ref >39)
LDL Chol Calc (NIH): 139 mg/dL — ABNORMAL HIGH (ref 0–99)
Triglycerides: 150 mg/dL — ABNORMAL HIGH (ref 0–149)
VLDL Cholesterol Cal: 27 mg/dL (ref 5–40)

## 2024-04-28 LAB — CBC WITH DIFFERENTIAL/PLATELET
Basophils Absolute: 0 x10E3/uL (ref 0.0–0.2)
Basos: 0 %
EOS (ABSOLUTE): 0.1 x10E3/uL (ref 0.0–0.4)
Eos: 1 %
Hematocrit: 42.7 % (ref 34.0–46.6)
Hemoglobin: 14 g/dL (ref 11.1–15.9)
Immature Grans (Abs): 0 x10E3/uL (ref 0.0–0.1)
Immature Granulocytes: 0 %
Lymphocytes Absolute: 3 x10E3/uL (ref 0.7–3.1)
Lymphs: 40 %
MCH: 30.4 pg (ref 26.6–33.0)
MCHC: 32.8 g/dL (ref 31.5–35.7)
MCV: 93 fL (ref 79–97)
Monocytes Absolute: 0.8 x10E3/uL (ref 0.1–0.9)
Monocytes: 10 %
Neutrophils Absolute: 3.7 x10E3/uL (ref 1.4–7.0)
Neutrophils: 49 %
Platelets: 310 x10E3/uL (ref 150–450)
RBC: 4.61 x10E6/uL (ref 3.77–5.28)
RDW: 12.7 % (ref 11.7–15.4)
WBC: 7.6 x10E3/uL (ref 3.4–10.8)

## 2024-04-28 LAB — TSH+FREE T4
Free T4: 1 ng/dL (ref 0.82–1.77)
TSH: 2.64 u[IU]/mL (ref 0.450–4.500)

## 2024-04-30 ENCOUNTER — Ambulatory Visit: Payer: Self-pay | Admitting: Physician Assistant

## 2024-05-19 ENCOUNTER — Other Ambulatory Visit: Payer: Self-pay | Admitting: Physician Assistant

## 2024-05-19 DIAGNOSIS — F5101 Primary insomnia: Secondary | ICD-10-CM

## 2024-05-19 NOTE — Telephone Encounter (Unsigned)
 Copied from CRM [id number]. Topic: Clinical - Medication Refill >> May 19, 2024  9:36 AM Darshell M wrote: Medication:  ALPRAZolam  (XANAX ) 0.25 MG tablet    Has the patient contacted their pharmacy? No (Agent: If no, request that the patient contact the pharmacy for the refill. If patient does not wish to contact the pharmacy document the reason why and proceed with request.) (Agent: If yes, when and what did the pharmacy advise?)  This is the patient's preferred pharmacy:  [facility] [address], [place] - [address] [place] [address] Phone: [phone] Fax: [phone]  Is this the correct pharmacy for this prescription? Yes If no, delete pharmacy and type the correct one.   Has the prescription been filled recently? No  Is the patient out of the medication? No  Has the patient been seen for an appointment in the last year OR does the patient have an upcoming appointment? Yes  Can we respond through MyChart? Yes  Agent: Please be advised that Rx refills may take up to 3 business days. We ask that you follow-up with your pharmacy.

## 2024-06-11 ENCOUNTER — Telehealth: Payer: Self-pay | Admitting: Physician Assistant

## 2024-06-11 MED ORDER — ESCITALOPRAM OXALATE 20 MG PO TABS: 20.0000 mg | ORAL_TABLET | Freq: Every day | ORAL | 2 refills | Status: AC

## 2024-06-11 NOTE — Telephone Encounter (Signed)
 Requesting prescription for  escitalopram  (LEXAPRO ) 20 M   [facility] [place]

## 2024-06-29 ENCOUNTER — Ambulatory Visit: Admitting: Physician Assistant
